# Patient Record
Sex: Female | Born: 1995 | Race: Black or African American | Hispanic: No | Marital: Single
Health system: Midwestern US, Community
[De-identification: ages and names within clinical notes are randomized; demographics above are authoritative.]

## PROBLEM LIST (undated history)

## (undated) DIAGNOSIS — K219 Gastro-esophageal reflux disease without esophagitis: Secondary | ICD-10-CM

## (undated) DIAGNOSIS — G43909 Migraine, unspecified, not intractable, without status migrainosus: Secondary | ICD-10-CM

## (undated) HISTORY — DX: Migraine, unspecified, not intractable, without status migrainosus: G43.909

## (undated) HISTORY — DX: Gastro-esophageal reflux disease without esophagitis: K21.9

## (undated) HISTORY — PX: WISDOM TOOTH EXTRACTION: SHX21

---
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
## 2017-01-23 ENCOUNTER — Encounter: Attending: Obstetrics & Gynecology

## 2017-08-26 ENCOUNTER — Encounter: Payer: Self-pay | Admitting: Nurse Practitioner

## 2017-09-03 ENCOUNTER — Ambulatory Visit (INDEPENDENT_AMBULATORY_CARE_PROVIDER_SITE_OTHER): Admitting: Physician Assistant

## 2017-09-03 ENCOUNTER — Encounter: Payer: Self-pay | Admitting: Physician Assistant

## 2017-09-03 ENCOUNTER — Other Ambulatory Visit (INDEPENDENT_AMBULATORY_CARE_PROVIDER_SITE_OTHER)

## 2017-09-03 VITALS — BP 118/60 | HR 86 | Ht 64.5 in | Wt 124.0 lb

## 2017-09-03 DIAGNOSIS — R630 Anorexia: Secondary | ICD-10-CM

## 2017-09-03 DIAGNOSIS — R1013 Epigastric pain: Secondary | ICD-10-CM

## 2017-09-03 LAB — COMPREHENSIVE METABOLIC PANEL
ALBUMIN: 4.2 g/dL (ref 3.5–5.2)
ALT: 10 U/L (ref 0–35)
AST: 15 U/L (ref 0–37)
Alkaline Phosphatase: 77 U/L (ref 47–119)
BILIRUBIN TOTAL: 0.1 mg/dL — AB (ref 0.2–1.2)
BUN: 13 mg/dL (ref 6–23)
CALCIUM: 10.2 mg/dL (ref 8.4–10.5)
CHLORIDE: 106 meq/L (ref 96–112)
CO2: 24 mEq/L (ref 19–32)
Creatinine, Ser: 1.13 mg/dL (ref 0.40–1.20)
GFR: 79.25 mL/min (ref >60.00)
Glucose, Bld: 101 mg/dL — ABNORMAL HIGH (ref 70–99)
Potassium: 3.9 mEq/L (ref 3.5–5.1)
SODIUM: 144 meq/L (ref 135–145)
TOTAL PROTEIN: 7.3 g/dL (ref 6.0–8.3)

## 2017-09-03 LAB — SEDIMENTATION RATE: Sed Rate: 24 mm/hr — ABNORMAL HIGH (ref 0–20)

## 2017-09-03 LAB — CBC WITH DIFFERENTIAL/PLATELET
BASOS ABS: 0.1 10*3/uL (ref 0.0–0.1)
BASOS PCT: 0.7 % (ref 0.0–3.0)
EOS ABS: 0.1 10*3/uL (ref 0.0–0.7)
Eosinophils Relative: 0.8 % (ref 0.0–5.0)
HEMATOCRIT: 37.8 % (ref 36.0–49.0)
HEMOGLOBIN: 12 g/dL (ref 12.0–16.0)
LYMPHS PCT: 31.8 % (ref 24.0–48.0)
Lymphs Abs: 3.3 10*3/uL (ref 0.7–4.0)
MCHC: 31.9 g/dL (ref 31.0–37.0)
MCV: 72.5 fl — ABNORMAL LOW (ref 78.0–98.0)
MONO ABS: 0.7 10*3/uL (ref 0.1–1.0)
Monocytes Relative: 6.5 % (ref 3.0–12.0)
Neutro Abs: 6.2 10*3/uL (ref 1.4–7.7)
Neutrophils Relative %: 60.2 % (ref 43.0–71.0)
PLATELETS: 361 10*3/uL (ref 150.0–575.0)
RBC: 5.21 Mil/uL (ref 3.80–5.70)
RDW: 13.3 % (ref 11.4–15.5)
WBC: 10.4 10*3/uL (ref 4.5–13.5)

## 2017-09-03 LAB — LIPASE: Lipase: 27 U/L (ref 11.0–59.0)

## 2017-09-03 LAB — IBC PANEL
IRON: 68 ug/dL (ref 42–145)
Saturation Ratios: 15.1 % — ABNORMAL LOW (ref 20.0–50.0)
TRANSFERRIN: 321 mg/dL (ref 212.0–360.0)

## 2017-09-03 LAB — FERRITIN: Ferritin: 61.7 ng/mL (ref 10.0–291.0)

## 2017-09-03 LAB — HIGH SENSITIVITY CRP: CRP, High Sensitivity: 1.5 mg/L (ref 0.000–5.000)

## 2017-09-03 MED ORDER — OMEPRAZOLE 40 MG PO CPDR: 40.0000 mg | DELAYED_RELEASE_CAPSULE | Freq: Every day | ORAL | 2 refills | Status: DC

## 2017-09-03 NOTE — Progress Notes (Signed)
Reviewed and agree with initial management plan.  Malcolm T. Stark, MD FACG 

## 2017-09-03 NOTE — Patient Instructions (Signed)
Please go to the basement level to have your labs drawn.  Continue Omeprazole 40 mg, take 1 tab by mouth every morning x 2 months.   If you are age 20 or younger, your body mass index should be between 19-25. Your Body mass index is 20.96 kg/m. If this is out of the aformentioned range listed, please consider follow up with your Primary Care Provider.

## 2017-09-03 NOTE — Progress Notes (Signed)
Subjective:    Patient ID: Jennifer Smith, female    DOB: 11/28/1995, 22 y.o.   MRN: [id number]  HPI  [name] Cough is a pleasant 22 year old African American female who is a Consulting civil engineer at [employer].  She is self-referred today for evaluation of epigastric pain.  [facility]. Patient says her current symptoms started a couple of months ago with intermittent stomachaches she describes as an achy crampy feeling in her epigastrium and decrease in appetite.  She did not have any associated nausea vomiting fever diarrhea.  She has lost about 6 pounds total.  She says her bowels are moving well.  She has no complaints of heartburn or indigestion chronically.  She was started on a trial of omeprazole 40 mg daily by the student health center and says that that has been helpful but has not alleviated her symptoms.  She has been on omeprazole over the past 2-1/2 weeks.  Says generally her symptoms were more noticeable early in the mornings.  She does have migraines but says she does not take any aspirin or NSAIDs on a regular basis.  She denies excessive EtOH use.  She does admit to being significantly stressed over the past couple of months says she has had a lot going on outside of school personally. We did obtain copies of labs done on 08/15/2017 which showed normal chemistries, amylase was mildly elevated at 114, lipase 21, WBC 11.3, hemoglobin 11.7 hematocrit of 36 and MCV of 72.3 We also received some of these labs after patient left the office and H. pylori breath testing was done and was negative.  Urine hCG was done and negative, as well as STD testing. Family history is negative for GI disease other than a second-degree relative with Crohn's.  Review of Systems Pertinent positive and negative review of systems were noted in the above HPI section.  All other review of systems was otherwise negative.  Outpatient Encounter Medications as of 09/03/2017  Medication Sig    . cyproheptadine (PERIACTIN) 4 MG tablet Take 4 mg by mouth 2 (two) times daily.  Marland Kitchen omeprazole (PRILOSEC) 40 MG capsule Take 1 capsule (40 mg total) by mouth daily.  . [DISCONTINUED] omeprazole (PRILOSEC) 40 MG capsule Take 40 mg by mouth daily.   No facility-administered encounter medications on file as of 09/03/2017.    No Known Allergies There are no active problems to display for this patient.  Social History   Socioeconomic History  . Marital status: Single    Spouse name: Not on file  . Number of children: Not on file  . Years of education: Not on file  . Highest education level: Not on file  Social Needs  . Financial resource strain: Not on file  . Food insecurity - worry: Not on file  . Food insecurity - inability: Not on file  . Transportation needs - medical: Not on file  . Transportation needs - non-medical: Not on file  Occupational History  . Not on file  Tobacco Use  . Smoking status: Never Smoker  . Smokeless tobacco: Never Used  Substance and Sexual Activity  . Alcohol use: No    Frequency: Never  . Drug use: No  . Sexual activity: Not on file  Other Topics Concern  . Not on file  Social History Narrative  . Not on file    Ms. Mies's family history includes Diabetes in her paternal grandmother.      Objective:  Vitals:   09/03/17 1058  BP: 118/60  Pulse: 86  SpO2: 98%    Physical Exam; well-developed young African-American female in no acute distress, accompanied by her mother blood pressure 118/60 pulse 86, height 5 foot 4, weight 124, BMI of 20.9.  HEENT ;nontraumatic normocephalic EOMI PERRLA sclerae anicteric, Cardiovascular; regular rate and rhythm with S1-S2 no murmur rub or gallop, Pulmonary; clear bilaterally, Abdomen soft, she has some minimal tenderness in the epigastrium no guarding or rebound no palpable mass or hepatosplenomegaly, Rectal ;exam not done, Extremities; no clubbing cyanosis or edema skin warm and dry, Neuro psych; mood  and affect appropriate       Assessment & Plan:   #791 22 year old female with 8739-month history of epigastric discomfort described as cramping and aching in initially with decrease in appetite.  She has had improvement with empiric omeprazole 40 mg every morning. Previous labs were obtained after her office visit which did show a mildly elevated WBC and mildly elevated amylase on 08/15/2017.  She is also microcytic.  Etiology of her symptoms is not entirely clear.  Suspect gastritis, possible peptic ulcer disease.  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 every morning x2 more months then stop We will check CBC, CMET,sed rate CRP, lipase today.  I also added iron studies due to microcytosis. Patient was advised to avoid NSAIDs and alcohol. Further plans pending results of labs. I do not think she needs endoscopic evaluation at this time.  If labs are unrevealing we will plan to follow-up in the office in about 6 weeks.    Amy S Esterwood PA-C 09/03/2017   Cc: No ref. provider found

## 2017-09-05 ENCOUNTER — Ambulatory Visit: Payer: Self-pay | Admitting: Nurse Practitioner

## 2017-09-05 LAB — H PYLORI, IGM, IGG, IGA AB: H pylori, IgM Abs: <9 units (ref 0.0–8.9)

## 2017-09-09 ENCOUNTER — Telehealth: Payer: Self-pay

## 2017-09-09 NOTE — Telephone Encounter (Signed)
Discussed lab results with the mother. Questions about the significance of the ESR. She states the daughter has been under significant stress. Also mentions the patient had "mono" in the past few months.  She will continue to talk with her daughter to monitor if she is not continuing to improve. Call back PRN concerns.

## 2017-10-02 ENCOUNTER — Telehealth: Payer: Self-pay | Admitting: Physician Assistant

## 2017-10-02 NOTE — Telephone Encounter (Signed)
I spoke with mom this morning.  Patient is scheduled for pre-op visit on 10/23/17 at 1:00pm and EGD on 10/29/17 at 3:30. I mailed her a coupon for FDgard as recommended by Dr Russella DarStark.

## 2017-10-02 NOTE — Telephone Encounter (Signed)
Schedule EGD in [facility] Increase omeprazole to 40 mg po bid If that is not helpful add FDgard 1-2 po tid

## 2017-10-02 NOTE — Telephone Encounter (Signed)
Mother says the patient has told her the symptoms are unchanged. She wakes up with abdominal pain. It will ease off during the day "mostly." She has had episodes of vomiting. Not daily vomiting. Patient states to her mother, " I am not able to eat all my food like I used to." Mother asks if she can have the EGD. Please advise.

## 2017-10-02 NOTE — Telephone Encounter (Signed)
Pt's mother Sheralyn Boatmanoni would like a call regarding pt, she is still dealing with same sxs.

## 2017-10-06 NOTE — Telephone Encounter (Signed)
Ok thank you 

## 2017-10-07 ENCOUNTER — Other Ambulatory Visit: Payer: Self-pay

## 2017-10-13 ENCOUNTER — Other Ambulatory Visit: Payer: Self-pay

## 2017-10-13 ENCOUNTER — Ambulatory Visit (AMBULATORY_SURGERY_CENTER): Payer: Self-pay | Admitting: *Deleted

## 2017-10-13 VITALS — Ht 64.5 in | Wt 123.0 lb

## 2017-10-13 DIAGNOSIS — R1013 Epigastric pain: Secondary | ICD-10-CM

## 2017-10-13 NOTE — Progress Notes (Signed)
Patient denies any allergies to eggs or soy. Patient denies any problems with anesthesia/sedation. Patient denies any oxygen use at home. Patient denies taking any diet/weight loss medications or blood thinners. EMMI education assisgned to patient on EGD, this was explained and instructions given to patient. 

## 2017-10-17 ENCOUNTER — Telehealth: Payer: Self-pay

## 2017-10-17 NOTE — Telephone Encounter (Signed)
Letter from 10/03/07 to patient about FDgard with coupon is still showing as open but I do believe that it has already been sent.  Just to be sure, I sent the letter and coupon again.

## 2017-10-20 ENCOUNTER — Encounter: Admitting: Gastroenterology

## 2017-10-21 ENCOUNTER — Ambulatory Visit: Admitting: Physician Assistant

## 2017-10-23 ENCOUNTER — Encounter

## 2017-10-28 ENCOUNTER — Other Ambulatory Visit: Payer: Self-pay

## 2017-10-28 ENCOUNTER — Encounter: Payer: Self-pay | Admitting: Gastroenterology

## 2017-10-28 ENCOUNTER — Ambulatory Visit (AMBULATORY_SURGERY_CENTER): Admitting: Gastroenterology

## 2017-10-28 VITALS — BP 103/56 | HR 109 | Temp 98.6°F | Resp 20 | Ht 64.5 in | Wt 123.0 lb

## 2017-10-28 DIAGNOSIS — K319 Disease of stomach and duodenum, unspecified: Secondary | ICD-10-CM

## 2017-10-28 DIAGNOSIS — K2950 Unspecified chronic gastritis without bleeding: Secondary | ICD-10-CM | POA: Diagnosis not present

## 2017-10-28 DIAGNOSIS — R1013 Epigastric pain: Secondary | ICD-10-CM

## 2017-10-28 NOTE — Patient Instructions (Signed)
Thank you for allowing [place] to care for you today!  Await pathology results by mail, approx. 2 weeks.  Resume previous medications and diet.  Try FDgard 1-2 tabs by mouth three times/day as needed for abdominal pain.  Contact GI office for appointment as needed.     YOU HAD AN ENDOSCOPIC PROCEDURE TODAY AT THE [facility]:   Refer to the procedure report that was given to you for any specific questions about what was found during the examination.  If the procedure report does not answer your questions, please call your gastroenterologist to clarify.  If you requested that your care partner not be given the details of your procedure findings, then the procedure report has been included in a sealed envelope for you to review at your convenience later.  YOU SHOULD EXPECT: Some feelings of bloating in the abdomen. Passage of more gas than usual.  Walking can help get rid of the air that was put into your GI tract during the procedure and reduce the bloating. If you had a lower endoscopy (such as a colonoscopy or flexible sigmoidoscopy) you may notice spotting of blood in your stool or on the toilet paper. If you underwent a bowel prep for your procedure, you may not have a normal bowel movement for a few days.  Please Note:  You might notice some irritation and congestion in your nose or some drainage.  This is from the oxygen used during your procedure.  There is no need for concern and it should clear up in a day or so.  SYMPTOMS TO REPORT IMMEDIATELY:    Following upper endoscopy (EGD)  Vomiting of blood or coffee ground material  New chest pain or pain under the shoulder blades  Painful or persistently difficult swallowing  New shortness of breath  Fever of 100F or higher  Black, tarry-looking stools  For urgent or emergent issues, a gastroenterologist can be reached at any hour by calling [phone].   DIET:  We do recommend a small meal at first, but then you may  proceed to your regular diet.  Drink plenty of fluids but you should avoid alcoholic beverages for 24 hours.  ACTIVITY:  You should plan to take it easy for the rest of today and you should NOT DRIVE or use heavy machinery until tomorrow (because of the sedation medicines used during the test).    FOLLOW UP: Our staff will call the number listed on your records the next business day following your procedure to check on you and address any questions or concerns that you may have regarding the information given to you following your procedure. If we do not reach you, we will leave a message.  However, if you are feeling well and you are not experiencing any problems, there is no need to return our call.  We will assume that you have returned to your regular daily activities without incident.  If any biopsies were taken you will be contacted by phone or by letter within the next 1-3 weeks.  Please call us at [phone] if you have not heard about the biopsies in 3 weeks.    SIGNATURES/CONFIDENTIALITY: You and/or your care partner have signed paperwork which will be entered into your electronic medical record.  These signatures attest to the fact that that the information above on your After Visit Summary has been reviewed and is understood.  Full responsibility of the confidentiality of this discharge information lies with you and/or your care-partner.

## 2017-10-28 NOTE — Progress Notes (Signed)
Called to room to assist during endoscopic procedure.  Patient ID and intended procedure confirmed with present staff. Received instructions for my participation in the procedure from the performing physician.  

## 2017-10-28 NOTE — Progress Notes (Signed)
Spontaneous respirations throughout. VSS. Resting comfortably. To PACU on room air. Report to  RN. 

## 2017-10-28 NOTE — Op Note (Addendum)
[facility] Patient Name: Jennifer Smith Procedure Date: 10/28/2017 1:31 PM MRN: [id number] Endoscopist: Meryl Dare , MD Age: 22 Referring MD:  Date of Birth: [birth year] Gender: Female Account #: [id number] Procedure:                Upper GI endoscopy Indications:              Epigastric abdominal pain Medicines:                Monitored Anesthesia Care Procedure:                Pre-Anesthesia Assessment:                           - Prior to the procedure, a History and Physical                            was performed, and patient medications and                            allergies were reviewed. The patient's tolerance of                            previous anesthesia was also reviewed. The risks                            and benefits of the procedure and the sedation                            options and risks were discussed with the patient.                            All questions were answered, and informed consent                            was obtained. Prior Anticoagulants: The patient has                            taken no previous anticoagulant or antiplatelet                            agents. ASA Grade Assessment: I - A normal, healthy                            patient. After reviewing the risks and benefits,                            the patient was deemed in satisfactory condition to                            undergo the procedure.                           After obtaining informed consent, the endoscope was  passed under direct vision. Throughout the                            procedure, the patient's blood pressure, pulse, and                            oxygen saturations were monitored continuously. The                            Endoscope was introduced through the mouth, and                            advanced to the second part of duodenum. The upper                            GI endoscopy was accomplished without  difficulty.                            The patient tolerated the procedure well. Scope In: Scope Out: Findings:                 The examined esophagus was normal.                           A single umbilicated lesion measuring 6 mm in                            diameter was found on the greater curvature of the                            gastric antrum. Biopsies were taken with a cold                            forceps for histology.                           Patchy mildly erythematous mucosa without bleeding                            was found in the gastric fundus and in the gastric                            body. Biopsies were taken with a cold forceps for                            histology.                           The exam of the stomach was otherwise normal.                           The duodenal bulb and second portion of the                            duodenum  were normal. Complications:            No immediate complications. Estimated Blood Loss:     Estimated blood loss was minimal. Impression:               - Normal esophagus.                           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 Biopsied.                           - Mildly erythematous mucosa in the gastric fundus                            and gastric body. Biopsied.                           - Normal duodenal bulb and second portion of the                            duodenum. Recommendation:           - Patient has a contact number available for                            emergencies. The signs and symptoms of potential                            delayed complications were discussed with the                            patient. Return to normal activities tomorrow.                            Written discharge instructions were provided to the                            patient.                           - Resume previous diet.                            - Continue present medications.                           - Await pathology results.                           - FDgard 1-2 PO TID as needed for abdominal pain                           - GI office appointment as needed Meryl Dare, MD 10/28/2017 1:50:32 PM This report has been signed electronically.

## 2017-10-29 ENCOUNTER — Telehealth: Payer: Self-pay | Admitting: *Deleted

## 2017-10-29 ENCOUNTER — Encounter: Admitting: Gastroenterology

## 2017-10-29 NOTE — Telephone Encounter (Signed)
No answer for post procedure call back. Will attempt to call back later this afternoon. SM 

## 2017-10-29 NOTE — Telephone Encounter (Signed)
Voicemail not set up so unable to leave message for post procedure call back. SM

## 2017-10-29 NOTE — Telephone Encounter (Signed)
Patient mom returned phone call stating that patient is feeling fine and not having any problems.

## 2017-11-09 ENCOUNTER — Encounter: Payer: Self-pay | Admitting: Gastroenterology

## 2017-11-11 ENCOUNTER — Telehealth: Payer: Self-pay | Admitting: Gastroenterology

## 2017-11-11 NOTE — Telephone Encounter (Signed)
I reviewed the results with the patient's mom.  All questions answered.  She will call back for any additional questions or concerns.

## 2017-12-05 ENCOUNTER — Telehealth: Payer: Self-pay | Admitting: Gastroenterology

## 2017-12-05 NOTE — Telephone Encounter (Signed)
I spoke with mom and reviewed the office note from September 03, 2017.  In that note it said for patient to continue the omeprazole for 2 months and then stop.  I gave mom this information.  EGD procedure note indicates the patient was given samples of FDguard with instructions to take 1-2 po up to TID as needed for abdominal pain. I asked mom to call in a week or two after patient stops omeprazole and let us know how she is doing.

## 2018-01-22 NOTE — Telephone Encounter (Signed)
Call received at [id number]    22 year old patient last seen in the office on 09/21/14.      Mother calling (on old HIPPA form) to ask about her daughter's PCP changing her ocp .     This nurse stated that patient needs to be seen for new patient appointment.    Mother verbalized understanding

## 2018-06-11 ENCOUNTER — Ambulatory Visit: Attending: Obstetrics & Gynecology

## 2018-06-11 ENCOUNTER — Ambulatory Visit: Admit: 2018-06-11 | Discharge: 2018-06-11 | Payer: PRIVATE HEALTH INSURANCE | Attending: Obstetrics & Gynecology

## 2018-06-11 DIAGNOSIS — L292 Pruritus vulvae: Secondary | ICD-10-CM

## 2018-06-11 LAB — AMB POC SMEAR, STAIN & INTERPRET, WET MOUNT
WET MOUNT POCT, WMPOCT: NEGATIVE
Wet mount (POC): NEGATIVE

## 2018-06-11 NOTE — Progress Notes (Signed)
Sent mychart message to pt

## 2018-06-11 NOTE — Patient Instructions (Signed)
Pelvic Exam: Care Instructions  Your Care Instructions    When your doctor examines all of your pelvic organs, it's called a pelvic exam. Two good reasons to have this kind of exam are to check for sexually transmitted infections (STIs) and to get a Pap test. A Pap test is also called a Pap smear. It checks for early changes that can lead to cancer of the cervix.  Sometimes a pelvic exam is part of a regular checkup. In this case, you can do some things to make your test results as accurate as possible.  ?? Try to schedule the exam when you don't have your period.  ?? Don't use douches, tampons, or vaginal medicines, sprays, or powders for 24 hours before your exam.  ?? Don't have sex for 24 hours before your exam.  Other times, women have this kind of exam at any time of the month. This is because they have pelvic pain, bleeding, or discharge. Or they may have another pelvic problem.  Before your exam, it's important to share some information with your doctor. For example, if you are a survivor of rape or sexual abuse, you can talk about any concerns you may have. Your doctor will also want to know if you are pregnant or use birth control. And he or she will want to hear about any problems, surgeries, or procedures you have had in your pelvic area. You will also need to tell your doctor when your last period was.  Follow-up care is a key part of your treatment and safety. Be sure to make and go to all appointments, and call your doctor if you are having problems. It's also a good idea to know your test results and keep a list of the medicines you take.  How is a pelvic exam done?  ?? During a pelvic exam, you will:  ? Take off your clothes below the waist. You will get a paper or cloth cover to put over the lower half of your body.  ? Lie on your back on an exam table. Your feet will be raised above you. Stirrups will support your feet.  ?? The doctor will:   ? Ask you to relax your knees. Your knees need to lean out, toward the walls.  ? Check the opening of your vagina for sores or swelling.  ? Gently put a tool called a speculum into your vagina. It opens the vagina a little bit. You will feel some pressure. But if you are relaxed, it will not hurt. It lets your doctor see inside the vagina.  ? Use a small brush, spatula, or swab to get a sample of cells, if you are having a Pap test or culture. The doctor then removes the speculum.  ? Put on gloves and put one or two fingers of one hand into your vagina. The other hand goes on your lower belly. This lets your doctor feel your pelvic organs. You will probably feel some pressure. Try to stay relaxed.  ? Put one gloved finger into your rectum and one into your vagina, if needed. This can also help check your pelvic organs.  This exam takes about 10 minutes. At the end, you will get a washcloth or tissue to clean your vaginal area. It's normal to have some discharge after this exam. You can then get dressed.  Some test results may be ready right away. But results from a culture or a Pap test may take several days or a   few weeks.  Why should you have a pelvic exam?  ?? You want to have recommended screening tests. This includes a Pap test.  ?? You think you have a vaginal infection. Signs include itching, burning, or unusual discharge.  ?? You might have been exposed to a sexually transmitted infection (STI), such as chlamydia or herpes.  ?? You have vaginal bleeding that is not part of your normal menstrual period.  ?? You have pain in your belly or pelvis.  ?? You have been sexually assaulted. A pelvic exam lets your doctor collect evidence and check for STIs.  ?? You are pregnant.  ?? You are having trouble getting pregnant.  What are the risks of a pelvic exam?  There are no risks from a pelvic exam.  When should you call for help?  Watch closely for changes in your health, and be sure to contact your  doctor if you have any problems.  Where can you learn more?  Go to http://www.healthwise.net/[employer].  Enter M421 in the search box to learn more about "Pelvic Exam: Care Instructions."  Current as of: August 05, 2017  Content Version: 12.2  ?? 2006-2019 [employer]. Care instructions adapted under license by [employer] (which disclaims liability or warranty for this information). If you have questions about a medical condition or this instruction, always ask your healthcare professional. [employer] disclaims any warranty or liability for your use of this information.

## 2018-06-11 NOTE — Progress Notes (Signed)
Chief Complaint   Pt complains of vulvar itching.       HPI  22 y.o. female complains of intermittent vulvar itching after getting a wax since August. LMP 05/04/18.  (Saw Dr. Providence LaniusHowell for single visit 09/2014)  She denies additional symptoms at this time. No vaginal discharge.  The patient  denies aggravating factors    Previous treatment included: using cream prescribed by urgent care (not sure what it was). Uses prn, does help, hasn't used consistently.  Pt also tried aquaphor to relieve irritation.    Has not waxed again, but does shave.  Would like vaginal swab for STD screening.  Declines blood work.    Past Medical History:   Diagnosis Date   ??? Cyclic vomiting syndrome    ??? Migraine    ??? Migraines      History reviewed. No pertinent surgical history.  Social History     Occupational History   ??? Not on file   Tobacco Use   ??? Smoking status: Never Smoker   ??? Smokeless tobacco: Never Used   Substance and Sexual Activity   ??? Alcohol use: No     Alcohol/week: 0.0 standard drinks   ??? Drug use: No   ??? Sexual activity: Never     Family History   Problem Relation Age of Onset   ??? Breast Cancer Paternal Grandmother         No Known Allergies  Prior to Admission medications    Medication Sig Start Date End Date Taking? Authorizing Provider   L-Norgest&E Estradiol-E Estrad (ASHLYNA) 0.15 mg-30 mcg (84)/10 mcg (7) 3MPk Take  by mouth.   Yes Provider, Historical   cyproheptadine HCl (CYPROHEPTADINE PO) Take  by mouth.   Yes Provider, Historical   Butalbital-Acetaminophen-Caff 50-300-40 mg cap  06/28/14  Yes Provider, Historical                      Review of Systems - History obtained from the patient  Constitutional: negative for weight loss, fever, night sweats  Breast: negative for breast lumps, nipple discharge, galactorrhea  GI: negative for change in bowel habits, abdominal pain, black or bloody stools  GU: negative for frequency, dysuria, hematuria  MSK: negative for back pain, joint pain, muscle pain   Skin: negative for itching, rash, hives  Neuro: negative for dizziness, headache, confusion, weakness  Psych: negative for anxiety, depression, change in mood  Heme/lymph: negative for bleeding, bruising, pallor       Objective:    Visit Vitals  BP 115/72 (BP 1 Location: Left arm, BP Patient Position: Sitting)   Pulse 80   Ht 5\' 4"  (1.626 m)   Wt 130 lb (59 kg)   LMP 05/04/2018 (Approximate)   BMI 22.31 kg/m??       Physical Exam:   PHYSICAL EXAMINATION    Constitutional  ?? Appearance: well-nourished, well developed, alert, in no acute distress    HENT  ?? Head and Face: appears normal    Genitourinary  ?? External Genitalia: normal appearance for age, no discharge present, no tenderness present, no inflammatory lesions present, no masses present, no atrophy present; shaven, no rash or focal lesions  ?? Vagina:  Scant white discharge present, otherwise normal vaginal vault without central or paravaginal defects, no inflammatory lesions present, no masses present  ?? Bladder: non-tender to palpation  ?? Urethra: appears normal  ?? Cervix: normal   ?? Uterus: normal size, shape and consistency  ?? Adnexa: no adnexal  tenderness present, no adnexal masses present  ?? Perineum: perineum within normal limits, no evidence of trauma, no rashes or skin lesions present  ?? Anus: anus within normal limits, no hemorrhoids present  ?? Inguinal Lymph Nodes: no lymphadenopathy present    Skin  ?? General Inspection: no rash, no lesions identified    Neurologic/Psychiatric  ?? Mental Status:  ?? Orientation: grossly oriented to person, place and time  ?? Mood and Affect: mood normal, affect appropriate          Results for orders placed or performed in visit on 06/11/18   AMB POC SMEAR, STAIN & INTERPRET, WET MOUNT   Result Value Ref Range    Wet mount (POC) negative     Narrative    WP/KOH    Hypae: negative  Buds: negative  Whiff: negative    Wet Prep:  Trich: negative  Clue cells: negative  Hyphae: negative  Buds: negative  WBC's: normal          Assessment:   Vulvar itching d/t shaving/waxing  Req STD screen    Plan:   Discussed itching d/t skin irritation from hair removal  Advised to avoid shaving/waxing  OTC cortisone. Apply 2-4x/d for 1-2wks  Nuswab G/C/T  Enc to schedule AE    Orders Placed This Encounter   ??? CT/NG/T.VAGINALIS AMPLIFICATION     Order Specific Question:   Specimen source     Answer:   Vaginal [[id number]]   ??? AMB POC SMEAR, STAIN & INTERPRET, WET MOUNT

## 2018-06-11 NOTE — Progress Notes (Signed)
Chief Complaint   Pt complains of vulvar itching.       HPI  22 y.o. female complains of intermittent vulvar itching after getting a wax since August. LMP 05/04/18.  (Saw Dr. Providence LaniusHowell for single visit 09/2014)  She denies additional symptoms at this time. No vaginal discharge.  The patient  denies aggravating factors    Previous treatment included: using cream prescribed by urgent care (not sure what it was). Uses prn, does help, hasn't used consistently.  Pt also tried aquaphor to relieve irritation.    Has not waxed again, but does shave.  Would like vaginal swab for STD screening.  Declines blood work.    Past Medical History:   Diagnosis Date   ??? Cyclic vomiting syndrome    ??? Migraine    ??? Migraines      History reviewed. No pertinent surgical history.  Social History     Occupational History   ??? Not on file   Tobacco Use   ??? Smoking status: Never Smoker   ??? Smokeless tobacco: Never Used   Substance and Sexual Activity   ??? Alcohol use: No     Alcohol/week: 0.0 standard drinks   ??? Drug use: No   ??? Sexual activity: Never     Family History   Problem Relation Age of Onset   ??? Breast Cancer Paternal Grandmother         No Known Allergies  Prior to Admission medications    Medication Sig Start Date End Date Taking? Authorizing Provider   L-Norgest&E Estradiol-E Estrad (ASHLYNA) 0.15 mg-30 mcg (84)/10 mcg (7) 3MPk Take  by mouth.   Yes Provider, Historical   cyproheptadine HCl (CYPROHEPTADINE PO) Take  by mouth.   Yes Provider, Historical   Butalbital-Acetaminophen-Caff 50-300-40 mg cap  06/28/14  Yes Provider, Historical                      Review of Systems - History obtained from the patient  Constitutional: negative for weight loss, fever, night sweats  Breast: negative for breast lumps, nipple discharge, galactorrhea  GI: negative for change in bowel habits, abdominal pain, black or bloody stools  GU: negative for frequency, dysuria, hematuria  MSK: negative for back pain, joint pain, muscle pain  Skin: negative for  itching, rash, hives  Neuro: negative for dizziness, headache, confusion, weakness  Psych: negative for anxiety, depression, change in mood  Heme/lymph: negative for bleeding, bruising, pallor       Objective:    Visit Vitals  BP 115/72 (BP 1 Location: Left arm, BP Patient Position: Sitting)   Pulse 80   Ht 5\' 4"  (1.626 m)   Wt 130 lb (59 kg)   LMP 05/04/2018 (Approximate)   BMI 22.31 kg/m??       Physical Exam:   PHYSICAL EXAMINATION    Constitutional  ?? Appearance: well-nourished, well developed, alert, in no acute distress    HENT  ?? Head and Face: appears normal    Genitourinary  ?? External Genitalia: normal appearance for age, no discharge present, no tenderness present, no inflammatory lesions present, no masses present, no atrophy present; shaven, no rash or focal lesions  ?? Vagina:  Scant white discharge present, otherwise normal vaginal vault without central or paravaginal defects, no inflammatory lesions present, no masses present  ?? Bladder: non-tender to palpation  ?? Urethra: appears normal  ?? Cervix: normal   ?? Uterus: normal size, shape and consistency  ?? Adnexa: no adnexal  tenderness present, no adnexal masses present  ?? Perineum: perineum within normal limits, no evidence of trauma, no rashes or skin lesions present  ?? Anus: anus within normal limits, no hemorrhoids present  ?? Inguinal Lymph Nodes: no lymphadenopathy present    Skin  ?? General Inspection: no rash, no lesions identified    Neurologic/Psychiatric  ?? Mental Status:  ?? Orientation: grossly oriented to person, place and time  ?? Mood and Affect: mood normal, affect appropriate          Results for orders placed or performed in visit on 06/11/18   AMB POC SMEAR, STAIN & INTERPRET, WET MOUNT   Result Value Ref Range    Wet mount (POC) negative     Narrative    WP/KOH    Hypae: negative  Buds: negative  Whiff: negative    Wet Prep:  Trich: negative  Clue cells: negative  Hyphae: negative  Buds: negative  WBC's: normal         Assessment:    Vulvar itching d/t shaving/waxing  Req STD screen    Plan:   Discussed itching d/t skin irritation from hair removal  Advised to avoid shaving/waxing  OTC cortisone. Apply 2-4x/d for 1-2wks  Nuswab G/C/T  Enc to schedule AE    Orders Placed This Encounter   ??? CT/NG/T.VAGINALIS AMPLIFICATION     Order Specific Question:   Specimen source     Answer:   Vaginal [[id number]]   ??? AMB POC SMEAR, STAIN & INTERPRET, WET MOUNT

## 2018-06-13 LAB — CT/NG/T.VAGINALIS AMPLIFICATION
C. trachomatis by NAA: NEGATIVE
CHLAMYDIA BY NAA, 183161: NEGATIVE
GONOCOCCUS BY NAA, 183162: NEGATIVE
N. gonorrhoeae by NAA: NEGATIVE
T. vaginalis by NAA: NEGATIVE
TRICH VAG BY NAA: NEGATIVE

## 2019-04-08 ENCOUNTER — Other Ambulatory Visit: Payer: Self-pay

## 2019-04-08 ENCOUNTER — Encounter (HOSPITAL_COMMUNITY): Payer: Self-pay | Admitting: Emergency Medicine

## 2019-04-08 ENCOUNTER — Emergency Department (HOSPITAL_COMMUNITY)

## 2019-04-08 ENCOUNTER — Emergency Department (HOSPITAL_COMMUNITY)
Admission: EM | Admit: 2019-04-08 | Discharge: 2019-04-09 | Disposition: A | Discharge status: Home / Self Care | Attending: Emergency Medicine | Admitting: Emergency Medicine

## 2019-04-08 DIAGNOSIS — Z733 Stress, not elsewhere classified: Secondary | ICD-10-CM | POA: Insufficient documentation

## 2019-04-08 DIAGNOSIS — Z79899 Other long term (current) drug therapy: Secondary | ICD-10-CM | POA: Diagnosis not present

## 2019-04-08 DIAGNOSIS — R002 Palpitations: Secondary | ICD-10-CM | POA: Insufficient documentation

## 2019-04-08 DIAGNOSIS — R079 Chest pain, unspecified: Secondary | ICD-10-CM | POA: Diagnosis not present

## 2019-04-08 LAB — CBC
HCT: 41.6 % (ref 36.0–46.0)
Hemoglobin: 12.6 g/dL (ref 12.0–15.0)
MCH: 23.2 pg — ABNORMAL LOW (ref 26.0–34.0)
MCHC: 30.3 g/dL (ref 30.0–36.0)
MCV: 76.5 fL — ABNORMAL LOW (ref 80.0–100.0)
Platelets: 390 10*3/uL (ref 150–400)
RBC: 5.44 MIL/uL — ABNORMAL HIGH (ref 3.87–5.11)
RDW: 13.9 % (ref 11.5–15.5)
WBC: 11.8 10*3/uL — ABNORMAL HIGH (ref 4.0–10.5)
nRBC: 0 % (ref 0.0–0.2)

## 2019-04-08 LAB — BASIC METABOLIC PANEL
Anion gap: 9 (ref 5–15)
BUN: 10 mg/dL (ref 6–20)
CO2: 22 mmol/L (ref 22–32)
Calcium: 9.5 mg/dL (ref 8.9–10.3)
Chloride: 107 mmol/L (ref 98–111)
Creatinine, Ser: 1.11 mg/dL — ABNORMAL HIGH (ref 0.44–1.00)
GFR calc Af Amer: >60 mL/min (ref >60)
GFR calc non Af Amer: >60 mL/min (ref >60)
Glucose, Bld: 75 mg/dL (ref 70–99)
Potassium: 3.6 mmol/L (ref 3.5–5.1)
Sodium: 138 mmol/L (ref 135–145)

## 2019-04-08 LAB — I-STAT BETA HCG BLOOD, ED (NOT ORDERABLE): I-stat hCG, quantitative: <5 m[IU]/mL (ref <5)

## 2019-04-08 LAB — TROPONIN I (HIGH SENSITIVITY)
Troponin I (High Sensitivity): <2 ng/L (ref <18)
Troponin I (High Sensitivity): <2 ng/L (ref <18)

## 2019-04-08 MED ORDER — SODIUM CHLORIDE 0.9% FLUSH: 3.0000 mL | Freq: Once | INTRAVENOUS | Status: DC

## 2019-04-08 NOTE — ED Notes (Signed)
PT CAME TO WINDOW. MADE HERSELF KNOW TO THIS WRITER. PT WENT TO ROOM

## 2019-04-08 NOTE — ED Provider Notes (Signed)
[facility]-EMERGENCY DEPT Provider Note   CSN: [id number] Arrival date & time: 04/08/19  1605     History   Chief Complaint Chief Complaint  Patient presents with  . Palpitations  . Chest Pain    HPI Jennifer Smith is a 23 y.o. female.     Patient presents to the emergency department with a chief complaint of palpitations.  She reports that the symptoms started on Sunday.  She states that they have been intermittent for the past several days.  She states that she was seen by an urgent care and had reassuring results.  She was also seen by her primary care doctor and was diagnosed with gastritis.  She has been treated with omeprazole.  She states that she has been under a lot of stress and believes that she has anxiety, but no diagnosis has been made.  She denies any history of PE or DVT.  She is on birth control.  She also reports having several recent long drives.  She denies any calf swelling or leg pain.  The history is provided by the patient. No language interpreter was used.    Past Medical History:  Diagnosis Date  . Acid reflux   . Migraine     There are no active problems to display for this patient.   Past Surgical History:  Procedure Laterality Date  . WISDOM TOOTH EXTRACTION       OB History   No obstetric history on file.      Home Medications    Prior to Admission medications   Medication Sig Start Date End Date Taking? Authorizing Provider  Black Elderberry (SAMBUCUS ELDERBERRY PO) Take 1 tablet by mouth daily.   Yes [provider]  cetirizine (ZYRTEC) 10 MG tablet Take 10 mg by mouth at bedtime. 04/07/19  Yes [provider]  fluticasone (FLONASE) 50 MCG/ACT nasal spray Place 1 spray into both nostrils daily. 04/07/19  Yes [provider]  Terri Piedra 91-Day (SEASONALE PO) Take 1 tablet by mouth daily.   Yes [provider]  omeprazole (PRILOSEC) 20 MG capsule Take 20 mg by mouth  daily. 04/07/19  Yes [provider]  pediatric multivitamin-iron (POLY-VI-SOL WITH IRON) 15 MG chewable tablet Chew 2 tablets by mouth daily.    Yes [provider]  omeprazole (PRILOSEC) 40 MG capsule Take 1 capsule (40 mg total) by mouth daily. Patient not taking: Reported on 04/08/2019 09/03/17   Sammuel Cooper, PA-C    Family History Family History  Problem Relation Age of Onset  . Diabetes Paternal Grandmother   . Colon cancer Neg Hx   . Esophageal cancer Neg Hx   . Stomach cancer Neg Hx     Social History Social History   Tobacco Use  . Smoking status: Never Smoker  . Smokeless tobacco: Never Used  Substance Use Topics  . Alcohol use: No    Frequency: Never  . Drug use: Yes    Types: Marijuana     Allergies   Patient has no known allergies.   Review of Systems Review of Systems  All other systems reviewed and are negative.    Physical Exam Updated Vital Signs BP 128/88 (BP Location: Left Arm)   Pulse 80   Temp 99.9 F (37.7 C) (Oral)   Resp 12   LMP 02/06/2019 (Within Days)   SpO2 100%   Physical Exam Vitals signs and nursing note reviewed.  Constitutional:      General: She  is not in acute distress.    Appearance: She is well-developed.  HENT:     Head: Normocephalic and atraumatic.  Eyes:     Conjunctiva/sclera: Conjunctivae normal.  Neck:     Musculoskeletal: Neck supple.  Cardiovascular:     Rate and Rhythm: Normal rate and regular rhythm.     Heart sounds: No murmur.  Pulmonary:     Effort: Pulmonary effort is normal. No respiratory distress.     Breath sounds: Normal breath sounds.  Abdominal:     Palpations: Abdomen is soft.     Tenderness: There is no abdominal tenderness.  Musculoskeletal: Normal range of motion.  Skin:    General: Skin is warm and dry.  Neurological:     Mental Status: She is alert and oriented to person, place, and time.  Psychiatric:        Mood and Affect: Mood normal.        Behavior:  Behavior normal.      ED Treatments / Results  Labs (all labs ordered are listed, but only abnormal results are displayed) Labs Reviewed  BASIC METABOLIC PANEL - Abnormal; Notable for the following components:      Result Value   Creatinine, Ser 1.11 (*)    All other components within normal limits  CBC - Abnormal; Notable for the following components:   WBC 11.8 (*)    RBC 5.44 (*)    MCV 76.5 (*)    MCH 23.2 (*)    All other components within normal limits  D-DIMER, QUANTITATIVE (NOT AT [facility])  I-STAT BETA HCG BLOOD, ED ([facility], [facility], [facility] ONLY)  I-STAT BETA HCG BLOOD, ED (NOT ORDERABLE)  TROPONIN I (HIGH SENSITIVITY)  TROPONIN I (HIGH SENSITIVITY)    EKG None  Radiology Dg Chest 2 View  Result Date: 04/08/2019 CLINICAL DATA:  Chest pain EXAM: CHEST - 2 VIEW COMPARISON:  None. FINDINGS: Normal heart size. Normal mediastinal contour. No pneumothorax. No pleural effusion. Lungs appear clear, with no acute consolidative airspace disease and no pulmonary edema. IMPRESSION: No active cardiopulmonary disease. Electronically Signed   By: Ilona Sorrel M.D.   On: 04/08/2019 16:54    Procedures Procedures (including critical care time)  Medications Ordered in ED Medications  sodium chloride flush (NS) 0.9 % injection 3 mL (3 mLs Intravenous Not Given 04/08/19 2142)     Initial Impression / Assessment and Plan / ED Course  I have reviewed the triage vital signs and the nursing notes.  Pertinent labs & imaging results that were available during my care of the patient were reviewed by me and considered in my medical decision making (see chart for details).        Patient with palpitations times several days.  EKG is reassuring, no arrhythmias.  Laboratory work-up is also normal save for a nonspecific leukocytosis of 11.8.  She was tested for coronavirus 2 days ago, and was negative.  She denies any cough or fever.  She denies any other associated symptoms.   D-dimer is negative.   Remaining labs are all reassuring.  Recommend PCP/cardiology follow-up.  Consider Holter monitor.  Patient is stable and ready for discharge.  Final Clinical Impressions(s) / ED Diagnoses   Final diagnoses:  Palpitations    ED Discharge Orders    None       Montine Circle, PA-C 04/09/19 0103    Drenda Freeze, MD 04/13/19 [phone]

## 2019-04-08 NOTE — ED Triage Notes (Signed)
Pt reports having chest pains and heart racing since Sunday. Was seen at her doctor on Monday and told she has gastritis. Reports smokes marijuana daily.

## 2019-04-08 NOTE — ED Notes (Signed)
OBSERVED PT LEAVING. PT STATING SHE WAS OUTSIDE EATING. PT MADE AWARE OF BED STATUS

## 2019-04-08 NOTE — ED Notes (Signed)
Patient has extra blood in the main lab.  One gold top and one blue top

## 2019-04-09 LAB — D-DIMER, QUANTITATIVE: D-Dimer, Quant: 0.4 ug/mL-FEU (ref 0.00–0.50)

## 2019-04-09 NOTE — Discharge Instructions (Addendum)
Your blood test to assess your risk for blood clot came back normal.  The palpitations you are experiencing, can be brought on by anxiety, stress, as well as for other reasons.  We would like for you to be seen by cardiology.  Please contact the cardiology group listed above.  The remainder of your tests in the emergency department all looked fine.  Continue to take the omeprazole.  If your symptoms change or worsen, return to the ER.

## 2019-04-09 NOTE — ED Notes (Signed)
Patient ambulated to restroom with no assistance and steady gait.  

## 2019-04-12 ENCOUNTER — Encounter: Payer: Self-pay | Admitting: Cardiology

## 2019-04-13 ENCOUNTER — Ambulatory Visit (INDEPENDENT_AMBULATORY_CARE_PROVIDER_SITE_OTHER): Admitting: Cardiology

## 2019-04-13 ENCOUNTER — Other Ambulatory Visit: Payer: Self-pay

## 2019-04-13 ENCOUNTER — Encounter: Payer: Self-pay | Admitting: Cardiology

## 2019-04-13 VITALS — BP 110/70 | HR 80 | Ht 64.5 in | Wt 118.0 lb

## 2019-04-13 DIAGNOSIS — R002 Palpitations: Secondary | ICD-10-CM | POA: Diagnosis not present

## 2019-04-13 NOTE — Progress Notes (Signed)
Cardiology Office Note:    Date:  04/13/2019   ID:  Jennifer Smith, DOB [birth year], MRN [id number]  PCP:  Patient, No Pcp Per  Cardiologist:  No primary care provider on file.  Electrophysiologist:  None   Referring MD: No ref. provider found     History of Present Illness:    Jennifer Smith is a 23 y.o. female here for the evaluation of palpitations.  She was in the emergency room on 04/08/2019-been intermittent.  Primary care doctor had previously diagnosed her with some gastritis.  Omeprazole.  She been under a lot of stress and anxiety.  No prior history of PE DVT.  D-dimer was negative.  Coronavirus test was negative.  Her heart rate does feel fast when it seems to accompany anxiety.  No higher symptoms such as syncope or chest pain.  There is no specific time of day that this seems to occur.  With or without activity.  Does not realize if it is abrupt on or off.  No smoking, no alcohol no stimulants.  No early family history of sudden cardiac death or chest pain.  Mom went to heart MD because of click several years ago.   Past Medical History:  Diagnosis Date  . Acid reflux   . Migraine     Past Surgical History:  Procedure Laterality Date  . WISDOM TOOTH EXTRACTION      Current Medications: Current Meds  Medication Sig  . Black Elderberry (SAMBUCUS ELDERBERRY PO) Take 1 tablet by mouth daily.  . cetirizine (ZYRTEC) 10 MG tablet Take 10 mg by mouth at bedtime.  . fluticasone (FLONASE) 50 MCG/ACT nasal spray Place 1 spray into both nostrils daily.  [name] 91-Day (SEASONALE PO) Take 1 tablet by mouth daily.  Marland Kitchen omeprazole (PRILOSEC) 20 MG capsule Take 20 mg by mouth daily.  . pediatric multivitamin-iron (POLY-VI-SOL WITH IRON) 15 MG chewable tablet Chew 2 tablets by mouth daily.      Allergies:   Patient has no known allergies.   Social History   Socioeconomic History  . Marital status: Single    Spouse name: Not on file  . Number of children:  Not on file  . Years of education: Not on file  . Highest education level: Not on file  Occupational History  . Not on file  Social Needs  . Financial resource strain: Not on file  . Food insecurity    Worry: Not on file    Inability: Not on file  . Transportation needs    Medical: Not on file    Non-medical: Not on file  Tobacco Use  . Smoking status: Never Smoker  . Smokeless tobacco: Never Used  Substance and Sexual Activity  . Alcohol use: No    Frequency: Never  . Drug use: Yes    Types: Marijuana  . Sexual activity: Not on file  Lifestyle  . Physical activity    Days per week: Not on file    Minutes per session: Not on file  . Stress: Not on file  Relationships  . Social Herbalist on phone: Not on file    Gets together: Not on file    Attends religious service: Not on file    Active member of club or organization: Not on file    Attends meetings of clubs or organizations: Not on file    Relationship status: Not on file  Other Topics Concern  . Not on file  Social History Narrative  .  Not on file     Family History: The patient's family history includes Diabetes in her paternal grandmother. There is no history of Colon cancer, Esophageal cancer, or Stomach cancer.  ROS:   Please see the history of present illness.    Denies any fevers chills nausea vomiting syncope bleeding all other systems reviewed and are negative.  EKGs/Labs/Other Studies Reviewed:    The following studies were reviewed today: Chest x-ray 04/08/2019-no active disease.  EKG:  EKG is ordered today.  The ekg ordered today demonstrates sinus rhythm 80 with no other abnormalities.  Sinus rhythm with no other significant abnormalities personally reviewed from 04/09/2019.  Recent Labs: 04/08/2019: BUN 10; Creatinine, Ser 1.11; Hemoglobin 12.6; Platelets 390; Potassium 3.6; Sodium 138  Recent Lipid Panel No results found for: CHOL, TRIG, HDL, CHOLHDL, VLDL, LDLCALC, LDLDIRECT   Physical Exam:    VS:  BP 110/70   Pulse 80   Ht 5' 4.5" (1.638 m)   Wt 118 lb (53.5 kg)   SpO2 99%   BMI 19.94 kg/m     Wt Readings from Last 3 Encounters:  04/13/19 118 lb (53.5 kg)  10/28/17 123 lb (55.8 kg) (40 %, Z= -0.25)*  10/13/17 123 lb (55.8 kg) (40 %, Z= -0.25)*   * Growth percentiles are based on CDC (Girls, 2-20 Years) data.     GEN:  Well nourished, well developed in no acute distress HEENT: Normal NECK: No JVD; No carotid bruits LYMPHATICS: No lymphadenopathy CARDIAC: RRR, no murmurs, rubs, gallops RESPIRATORY:  Clear to auscultation without rales, wheezing or rhonchi  ABDOMEN: Soft, non-tender, non-distended MUSCULOSKELETAL:  No edema; No deformity  SKIN: Warm and dry NEUROLOGIC:  Alert and oriented x 3 PSYCHIATRIC:  Normal affect   ASSESSMENT:    1. Palpitations    PLAN:    In order of problems listed above:  Palpitations -Often accompanies anxiety, may just be sinus tachycardia as a natural response.  However, I will check a Zio patch monitor to ensure that she is not having any adverse or dangerous arrhythmias.  She has not had any higher symptoms such as syncope.  No chest discomfort with these situations.  I will check a TSH and a free T4 to ensure that there is no evidence of thyroid dysfunction.  I will also check her lipid panel for baseline.  All of her other blood work looked normal in the emergency room.  She does not have any murmurs on exam.  No signs of cardiomegaly on chest x-ray.  EKG is normal.     Medication Adjustments/Labs and Tests Ordered: Current medicines are reviewed at length with the patient today.  Concerns regarding medicines are outlined above.  Orders Placed This Encounter  Procedures  . TSH  . T4, free  . Lipid panel  . LONG TERM MONITOR (3-14 DAYS)  . EKG 12-Lead   No orders of the defined types were placed in this encounter.   Patient Instructions  Medication Instructions:  The current medical regimen is  effective;  continue present plan and medications.  *If you need a refill on your cardiac medications before your next appointment, please call your pharmacy*  Lab Work: Please have blood work today. (TSH, Free T4, Lipid) If you have labs (blood work) drawn today and your tests are completely normal, you will receive your results only by: Marland Kitchen MyChart Message (if you have MyChart) OR . A paper copy in the mail If you have any lab test that is abnormal or  we need to change your treatment, we will call you to review the results.  Testing/Procedures: Your physician has recommended that you wear a holter monitor for 14 days. Holter monitors are medical devices that record the heart's electrical activity. Doctors most often use these monitors to diagnose arrhythmias. Arrhythmias are problems with the speed or rhythm of the heartbeat. The monitor is a small, portable device. You can wear one while you do your normal daily activities. This is usually used to diagnose what is causing palpitations/syncope (passing out).  Follow-Up: Your next appointment:   Follow up will be determined after the above testing has been completed.  Thank you for choosing [facility]!!        Signed, Donato SchultzMark Skains, MD  04/13/2019 5:13 PM    [facility]

## 2019-04-13 NOTE — Patient Instructions (Addendum)
Medication Instructions:  The current medical regimen is effective;  continue present plan and medications.  *If you need a refill on your cardiac medications before your next appointment, please call your pharmacy*  Lab Work: Please have blood work today. (TSH, Free T4, Lipid) If you have labs (blood work) drawn today and your tests are completely normal, you will receive your results only by: Marland Kitchen MyChart Message (if you have MyChart) OR . A paper copy in the mail If you have any lab test that is abnormal or we need to change your treatment, we will call you to review the results.  Testing/Procedures: Your physician has recommended that you wear a holter monitor for 14 days. Holter monitors are medical devices that record the heart's electrical activity. Doctors most often use these monitors to diagnose arrhythmias. Arrhythmias are problems with the speed or rhythm of the heartbeat. The monitor is a small, portable device. You can wear one while you do your normal daily activities. This is usually used to diagnose what is causing palpitations/syncope (passing out).  Follow-Up: Your next appointment:   Follow up will be determined after the above testing has been completed.  Thank you for choosing [facility]!!

## 2019-04-14 LAB — LIPID PANEL
Chol/HDL Ratio: 2.9 ratio (ref 0.0–4.4)
Cholesterol, Total: 134 mg/dL (ref 100–199)
HDL: 47 mg/dL (ref >39)
LDL Chol Calc (NIH): 74 mg/dL (ref 0–99)
Triglycerides: 59 mg/dL (ref 0–149)
VLDL Cholesterol Cal: 13 mg/dL (ref 5–40)

## 2019-04-14 LAB — T4, FREE: Free T4: 1.57 ng/dL (ref 0.82–1.77)

## 2019-04-14 LAB — TSH: TSH: 0.824 u[IU]/mL (ref 0.450–4.500)

## 2019-04-26 ENCOUNTER — Telehealth: Payer: Self-pay

## 2019-04-26 ENCOUNTER — Ambulatory Visit: Admitting: Cardiovascular Disease

## 2019-04-26 NOTE — Telephone Encounter (Signed)
Spoke to pt, went over monitor instructions. She is currently in [place] and would like the monitor mailed to that address:  [address] [place] [address]  14 day ZIO ordered.

## 2019-04-30 NOTE — Telephone Encounter (Signed)
Patient called today stating that she still has not received her monitor.  It was shipped out on Tuesday.

## 2019-05-02 ENCOUNTER — Ambulatory Visit (INDEPENDENT_AMBULATORY_CARE_PROVIDER_SITE_OTHER)

## 2019-05-02 DIAGNOSIS — R002 Palpitations: Secondary | ICD-10-CM

## 2019-05-03 NOTE — Telephone Encounter (Signed)
ZIO called. Monitor delivered 05/01/2019 4:38 PM to parcel locker.  Called patient to confirm.

## 2019-05-06 ENCOUNTER — Other Ambulatory Visit: Payer: Self-pay | Admitting: Internal Medicine

## 2019-05-06 DIAGNOSIS — E049 Nontoxic goiter, unspecified: Secondary | ICD-10-CM

## 2019-05-18 ENCOUNTER — Other Ambulatory Visit

## 2019-06-01 ENCOUNTER — Ambulatory Visit
Admission: RE | Admit: 2019-06-01 | Discharge: 2019-06-01 | Disposition: A | Discharge status: Home / Self Care | Source: Ambulatory Visit | Attending: Internal Medicine | Admitting: Internal Medicine

## 2019-06-01 DIAGNOSIS — E049 Nontoxic goiter, unspecified: Secondary | ICD-10-CM

## 2019-08-19 ENCOUNTER — Ambulatory Visit: Attending: Family

## 2019-08-19 DIAGNOSIS — Z23 Encounter for immunization: Secondary | ICD-10-CM | POA: Insufficient documentation

## 2019-08-19 NOTE — Progress Notes (Signed)
   Covid-19 Vaccination Clinic  Name:  Allison Deshotels    MRN: [id number] DOB: 09-11-1995  08/19/2019  Ms. Spilker was observed post Covid-19 immunization for 15 minutes without incident. She was provided with Vaccine Information Sheet and instruction to access the V-Safe system.   Ms. Salonga was instructed to call 911 with any severe reactions post vaccine: Marland Kitchen Difficulty breathing  . Swelling of face and throat  . A fast heartbeat  . A bad rash all over body  . Dizziness and weakness   Immunizations Administered    Name Date Dose VIS Date Route   Moderna COVID-19 Vaccine 08/19/2019  1:14 PM 0.5 mL 05/18/2019 Intramuscular   Manufacturer: Moderna   Lot: [id number]   NDC: 31281-188-67

## 2019-09-21 ENCOUNTER — Ambulatory Visit: Attending: Family

## 2019-09-21 DIAGNOSIS — Z23 Encounter for immunization: Secondary | ICD-10-CM

## 2019-09-21 NOTE — Progress Notes (Signed)
   Covid-19 Vaccination Clinic  Name:  Jennifer Smith    MRN: [id number] DOB: Sep 04, 1995  09/21/2019  [name] was observed post Covid-19 immunization for 15 minutes without incident. She was provided with Vaccine Information Sheet and instruction to access the V-Safe system.   [name] was instructed to call 911 with any severe reactions post vaccine: Marland Kitchen Difficulty breathing  . Swelling of face and throat  . A fast heartbeat  . A bad rash all over body  . Dizziness and weakness   Immunizations Administered    Name Date Dose VIS Date Route   Moderna COVID-19 Vaccine 09/21/2019  2:04 PM 0.5 mL 05/18/2019 Intramuscular   Manufacturer: Moderna   Lot: [id number]   NDC: 11021-117-35

## 2021-05-12 ENCOUNTER — Ambulatory Visit
Admission: EM | Admit: 2021-05-12 | Discharge: 2021-05-12 | Disposition: A | Discharge status: Home / Self Care | Payer: BC Managed Care – PPO | Attending: Physician Assistant | Admitting: Physician Assistant

## 2021-05-12 ENCOUNTER — Other Ambulatory Visit: Payer: Self-pay

## 2021-05-12 DIAGNOSIS — J069 Acute upper respiratory infection, unspecified: Secondary | ICD-10-CM | POA: Diagnosis not present

## 2021-05-12 IMAGING — CR DG CHEST 2V
2 series · 2 of 2 positions shown · non-contrast
Comparison: None.

CLINICAL DATA: Chest pain

EXAM:
CHEST - 2 VIEW

[w chest pa]
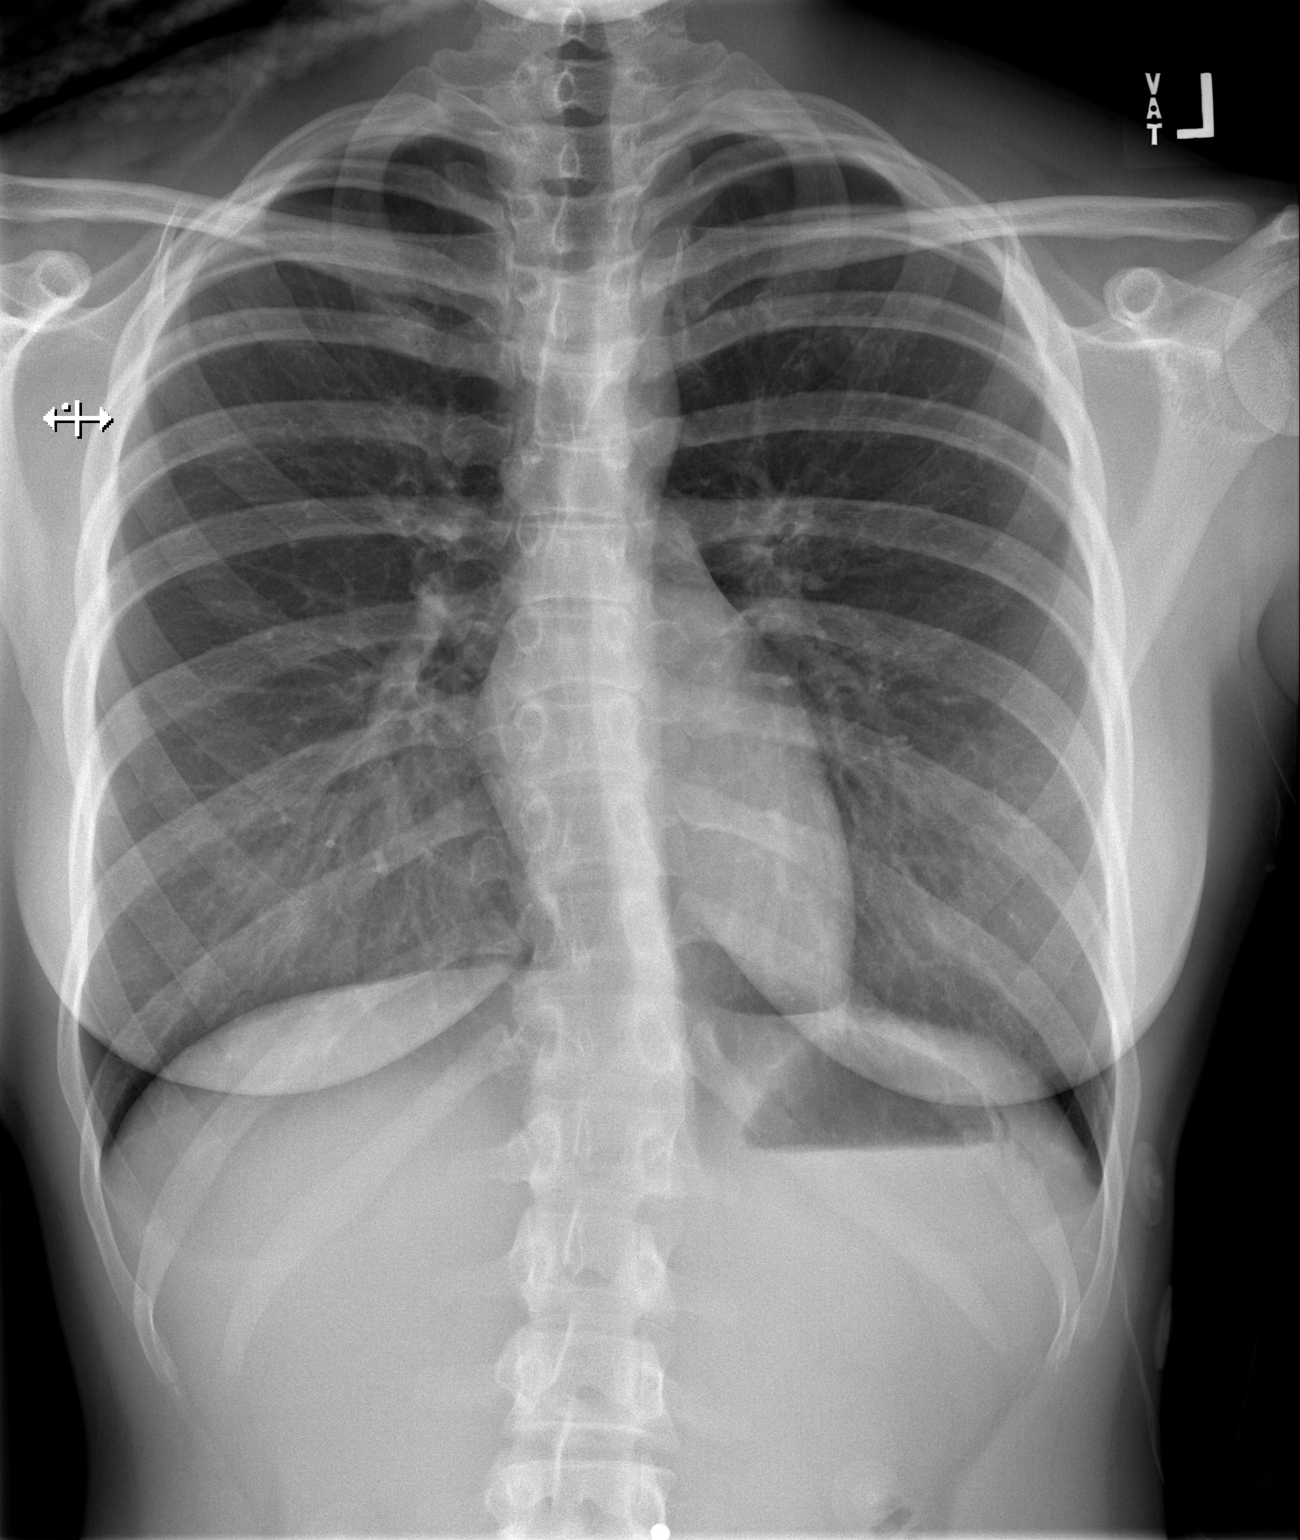

[w chest lat]
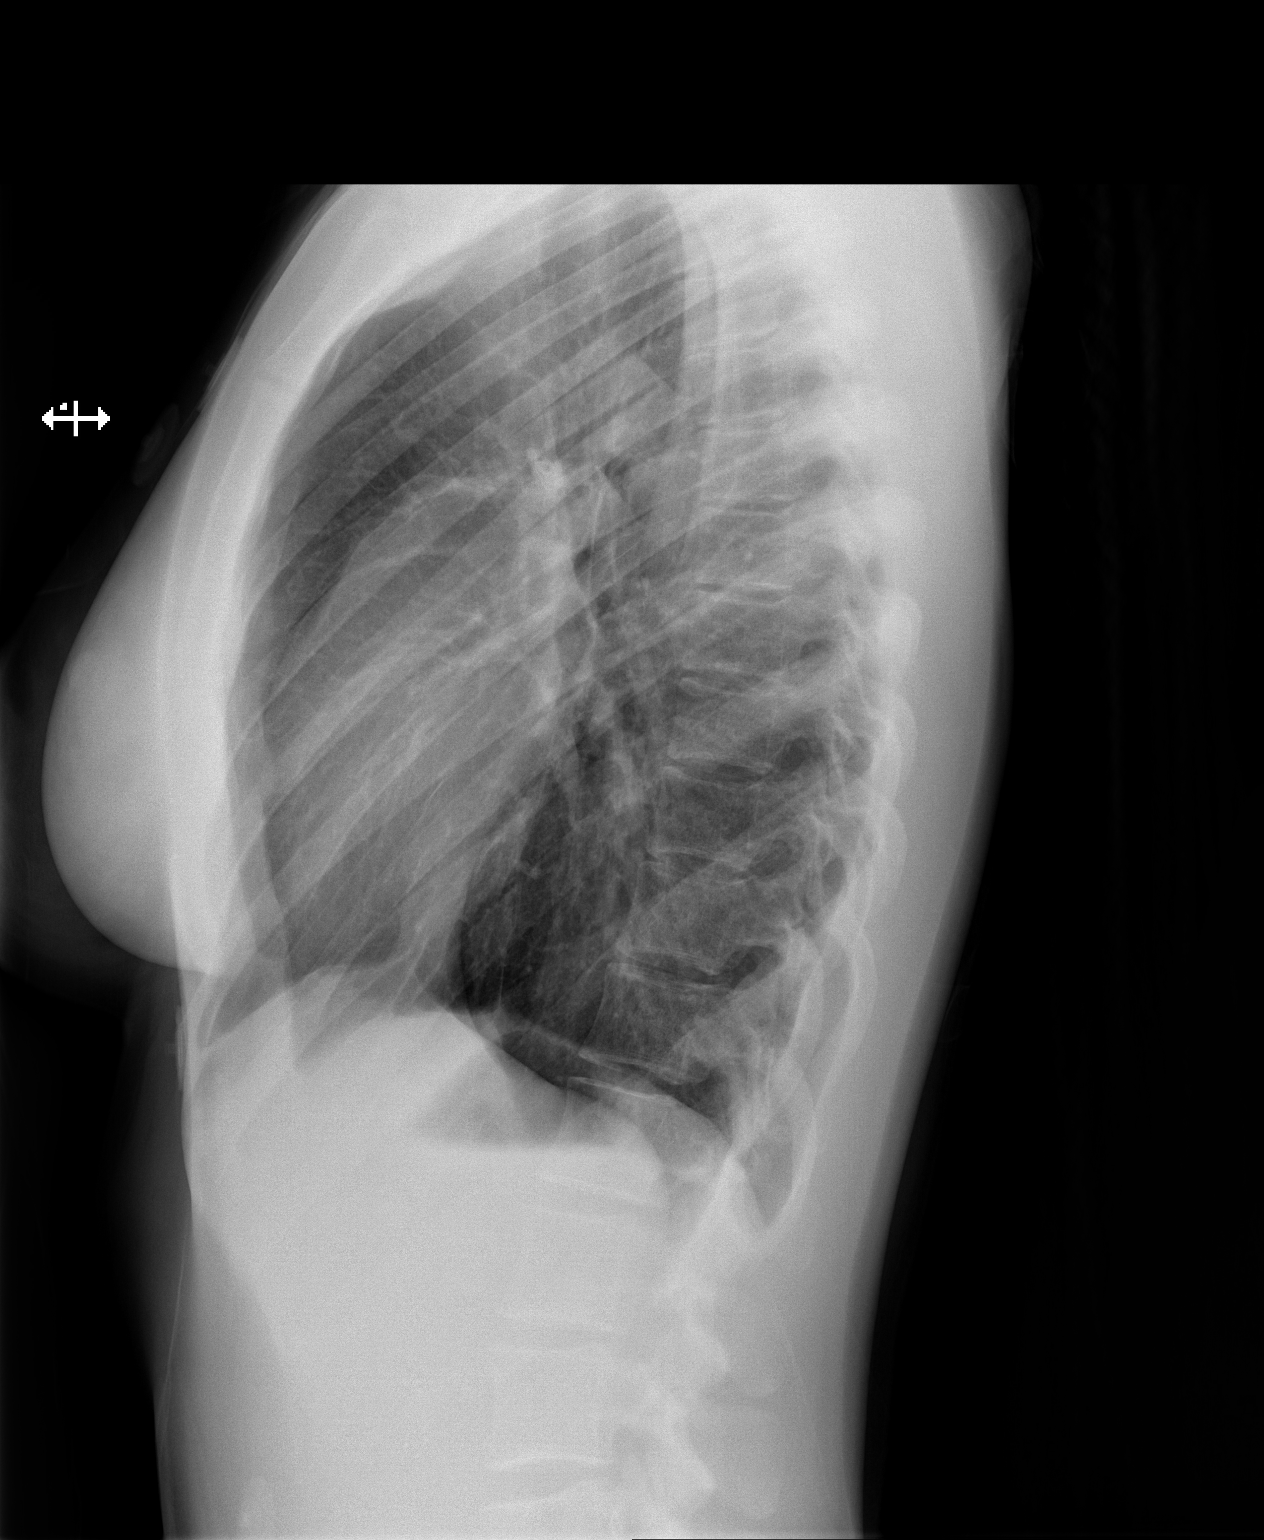

[2 of 2 positions shown; findings below may reference images not displayed]

FINDINGS: Normal heart size. Normal mediastinal contour. No pneumothorax. No
pleural effusion. Lungs appear clear, with no acute consolidative
airspace disease and no pulmonary edema.
IMPRESSION: No active cardiopulmonary disease.

## 2021-05-12 NOTE — ED Triage Notes (Signed)
4 day h/o cough and congestion, 3 days diarrhea and onset this morning of HA. Emesis x4. Has been taking nyquil and dayquil w/o relief. Seen by PCP 4 day ago, dx with cold and was advised to tx with otc meds per Mom. Has been taking tylenol for HA w/o relief.

## 2021-05-12 NOTE — ED Provider Notes (Signed)
[facility]    CSN: [id number] Arrival date & time: 05/12/21  1147      History   Chief Complaint Chief Complaint  Patient presents with   Cough   Diarrhea    HPI Jennifer Smith is a 25 y.o. female.   Patient here today for evaluation of cough, congestion that she has had for 4 days.  She reports she is also had some diarrhea that started 3 days ago.  This morning she developed headache.  She has had 4 episodes of vomiting.  She has tried DayQuil and NyQuil without relief.  4 days ago she saw her PCP and was diagnosed with upper respiratory infection and was screened at that time for flu and COVID.  Rapid screening was negative.  She has been taking Tylenol as well without significant relief.  The history is provided by the patient.  Cough Associated symptoms: sore throat   Associated symptoms: no chills, no ear pain, no eye discharge, no fever, no shortness of breath and no wheezing   Diarrhea Associated symptoms: no abdominal pain, no chills, no fever and no vomiting    Past Medical History:  Diagnosis Date   Acid reflux    Migraine     There are no problems to display for this patient.   Past Surgical History:  Procedure Laterality Date   WISDOM TOOTH EXTRACTION      OB History   No obstetric history on file.      Home Medications    Prior to Admission medications   Medication Sig Start Date End Date Taking? Authorizing Provider  Black Elderberry (SAMBUCUS ELDERBERRY PO) Take 1 tablet by mouth daily.    [provider]  cetirizine (ZYRTEC) 10 MG tablet Take 10 mg by mouth at bedtime. 04/07/19   [provider]  fluticasone (FLONASE) 50 MCG/ACT nasal spray Place 1 spray into both nostrils daily. 04/07/19   [provider]  Levonorgest-Eth Estrad 91-Day (SEASONALE PO) Take 1 tablet by mouth daily.    [provider]  omeprazole (PRILOSEC) 20 MG capsule Take 20 mg by mouth daily. 04/07/19   [provider]  pediatric multivitamin-iron (POLY-VI-SOL WITH IRON) 15 MG chewable tablet Chew 2 tablets by mouth daily.     [provider]    Family History Family History  Problem Relation Age of Onset   Diabetes Paternal Grandmother    Colon cancer Neg Hx    Esophageal cancer Neg Hx    Stomach cancer Neg Hx     Social History Social History   Tobacco Use   Smoking status: Never   Smokeless tobacco: Never  Vaping Use   Vaping Use: Never used  Substance Use Topics   Alcohol use: No   Drug use: Yes    Types: Marijuana     Allergies   Patient has no known allergies.   Review of Systems Review of Systems  Constitutional:  Negative for chills and fever.  HENT:  Positive for congestion, sinus pressure and sore throat. Negative for ear pain.   Eyes:  Negative for discharge and redness.  Respiratory:  Positive for cough. Negative for shortness of breath and wheezing.   Gastrointestinal:  Positive for diarrhea. Negative for abdominal pain, nausea and vomiting.    Physical Exam Triage Vital Signs ED Triage Vitals  Enc Vitals Group     BP 05/12/21 1237 121/81     Pulse Rate 05/12/21 1237 (!) 102     Resp 05/12/21  1237 18     Temp 05/12/21 1237 98.7 F (37.1 C)     Temp Source 05/12/21 1237 Oral     SpO2 05/12/21 1237 98 %     Weight --      Height --      Head Circumference --      Peak Flow --      Pain Score 05/12/21 1241 4     Pain Loc --      Pain Edu? --      Excl. in GC? --    No data found.  Updated Vital Signs BP 121/81 (BP Location: Left Arm)   Pulse (!) 102   Temp 98.7 F (37.1 C) (Oral)   Resp 18   SpO2 98%     Physical Exam Vitals and nursing note reviewed.  Constitutional:      General: She is not in acute distress.    Appearance: Normal appearance. She is not ill-appearing.  HENT:     Head: Normocephalic and atraumatic.     Right Ear: Tympanic membrane normal.     Left Ear: Tympanic membrane normal.     Nose: Congestion present.      Mouth/Throat:     Mouth: Mucous membranes are moist.     Pharynx: No oropharyngeal exudate or posterior oropharyngeal erythema.  Eyes:     Conjunctiva/sclera: Conjunctivae normal.  Cardiovascular:     Rate and Rhythm: Normal rate and regular rhythm.     Heart sounds: Normal heart sounds. No murmur heard. Pulmonary:     Effort: Pulmonary effort is normal. No respiratory distress.     Breath sounds: Normal breath sounds. No wheezing, rhonchi or rales.  Skin:    General: Skin is warm and dry.  Neurological:     Mental Status: She is alert.  Psychiatric:        Mood and Affect: Mood normal.        Thought Content: Thought content normal.     UC Treatments / Results  Labs (all labs ordered are listed, but only abnormal results are displayed) Labs Reviewed  COVID-19, FLU A+B NAA    EKG   Radiology No results found.  Procedures Procedures (including critical care time)  Medications Ordered in UC Medications - No data to display  Initial Impression / Assessment and Plan / UC Course  I have reviewed the triage vital signs and the nursing notes.  Pertinent labs & imaging results that were available during my care of the patient were reviewed by me and considered in my medical decision making (see chart for details).    Suspect likely viral etiology of symptoms will order COVID and flu PCR screening.  Discussed possibility of developing bronchitis as well and recommend steroid burst if symptoms persist and COVID is negative. Encouraged symptomatic treatment in the meantime.   Recommend follow-up with any further concerns  Final Clinical Impressions(s) / UC Diagnoses   Final diagnoses:  Acute upper respiratory infection   Discharge Instructions   None    ED Prescriptions   None    PDMP not reviewed this encounter.   Tomi Bamberger, PA-C 05/12/21 1301

## 2021-05-13 LAB — COVID-19, FLU A+B NAA
Influenza A, NAA: DETECTED — AB
Influenza B, NAA: NOT DETECTED
SARS-CoV-2, NAA: NOT DETECTED

## 2021-05-14 ENCOUNTER — Telehealth (HOSPITAL_COMMUNITY): Payer: Self-pay | Admitting: Emergency Medicine

## 2021-05-14 NOTE — Telephone Encounter (Signed)
Opened in error

## 2021-05-20 ENCOUNTER — Telehealth: Payer: Self-pay

## 2021-05-20 NOTE — Telephone Encounter (Signed)
RN attempted to return phone call for something to do with work note for recent encounter. Call sent to VM.

## 2021-05-29 ENCOUNTER — Other Ambulatory Visit: Payer: Self-pay | Admitting: Family Medicine

## 2021-05-29 DIAGNOSIS — R7401 Elevation of levels of liver transaminase levels: Secondary | ICD-10-CM

## 2021-06-25 ENCOUNTER — Ambulatory Visit: Payer: BC Managed Care – PPO | Admitting: Psychiatry

## 2021-06-25 ENCOUNTER — Encounter: Payer: Self-pay | Admitting: *Deleted

## 2021-06-25 VITALS — BP 124/82 | HR 90 | Ht 62.0 in | Wt 134.0 lb

## 2021-06-25 DIAGNOSIS — G43009 Migraine without aura, not intractable, without status migrainosus: Secondary | ICD-10-CM | POA: Diagnosis not present

## 2021-06-25 DIAGNOSIS — G43909 Migraine, unspecified, not intractable, without status migrainosus: Secondary | ICD-10-CM | POA: Insufficient documentation

## 2021-06-25 MED ORDER — ZOLMITRIPTAN 5 MG PO TABS: 5.0000 mg | ORAL_TABLET | ORAL | 3 refills | Status: DC | PRN

## 2021-06-25 NOTE — Progress Notes (Signed)
Referring:  Audley Hose, MD [address] [place],  [place] [address]  PCP: Pcp, No  Neurology was asked to evaluate Jennifer Smith, a 26 year old female for a chief complaint of headaches.  Our recommendations of care will be communicated by shared medical record.    CC:  headaches  HPI:  Medical co-morbidities: CKD 2  The patient presents for evaluation of migraines which have been present since she was a child. They are described as holocephalic throbbing pain with associated photophobia and phonophobia. They can last several hours at a time. Migraines are mostly associated with her menstrual cycle but can occur other times as well. She is on birth control so she only gets a period every 3 months.  Typically she has 1-2 migraines per week. She did recently have a period where she had a headache daily for 4 weeks straight.  Previously tried Excedrin which was ineffective. Tylenol but had to stop due to LFTs. Imitrex made her vomit. Now doesn't take anything as needed.  Headache History: Onset: childhood Triggers: menstrual cycle, stress, fasting, fasting Aura: none Location: anywhere Quality/Description: throbbing, aching Associated Symptoms:  Photophobia: yes  Phonophobia: yes  Nausea: no Vomiting: no Worse with activity?: yes Duration of headaches: several hours  Birth control: OCPs  Headache days per month: 8 Headache free days per month: 22  Current Treatment: Abortive none  Preventative none  Prior Therapies                                 Imitrex - vomiting Tylenol Excedrin  LABS: CBC    Component Value Date/Time   WBC 11.8 (H) 04/08/2019 1655   RBC 5.44 (H) 04/08/2019 1655   HGB 12.6 04/08/2019 1655   HCT 41.6 04/08/2019 1655   PLT 390 04/08/2019 1655   MCV 76.5 (L) 04/08/2019 1655   MCH 23.2 (L) 04/08/2019 1655   MCHC 30.3 04/08/2019 1655   RDW 13.9 04/08/2019 1655   LYMPHSABS 3.3 09/03/2017 1156   MONOABS 0.7 09/03/2017  1156   EOSABS 0.1 09/03/2017 1156   BASOSABS 0.1 09/03/2017 1156   CMP Latest Ref Rng & Units 04/08/2019 09/03/2017  Glucose 70 - 99 mg/dL 75 101(H)  BUN 6 - 20 mg/dL 10 13  Creatinine 0.44 - 1.00 mg/dL 1.11(H) 1.13  Sodium 135 - 145 mmol/L 138 144  Potassium 3.5 - 5.1 mmol/L 3.6 3.9  Chloride 98 - 111 mmol/L 107 106  CO2 22 - 32 mmol/L 22 24  Calcium 8.9 - 10.3 mg/dL 9.5 10.2  Total Protein 6.0 - 8.3 g/dL - 7.3  Total Bilirubin 0.2 - 1.2 mg/dL - 0.1(L)  Alkaline Phos 47 - 119 U/L - 77  AST 0 - 37 U/L - 15  ALT 0 - 35 U/L - 10     IMAGING:  MRI brain when she was 14-15 was reportedly normal  Current Outpatient Medications on File Prior to Visit  Medication Sig Dispense Refill   Levonorgest-Eth Estrad 91-Day (SEASONALE PO) Take 1 tablet by mouth daily.     pediatric multivitamin-iron (POLY-VI-SOL WITH IRON) 15 MG chewable tablet Chew 2 tablets by mouth daily.      No current facility-administered medications on file prior to visit.     Allergies: No Known Allergies  Family History: Migraine or other headaches in the family:  grandmother Aneurysms in a first degree relative:  no Brain tumors in the  family:  no Other neurological illness in the family:   no  Past Medical History: Past Medical History:  Diagnosis Date   Acid reflux    Migraine     Past Surgical History Past Surgical History:  Procedure Laterality Date   WISDOM TOOTH EXTRACTION      Social History: Social History   Tobacco Use   Smoking status: Never   Smokeless tobacco: Never  Vaping Use   Vaping Use: Never used  Substance Use Topics   Alcohol use: No   Drug use: Yes    Types: Marijuana     ROS: Negative for fevers, chills. Positive for headaches. All other systems reviewed and negative unless stated otherwise in HPI.   Physical Exam:   Vital Signs: BP 124/82    Pulse 90    Ht 5\' 2"  (1.575 m)    Wt 134 lb (60.8 kg)    SpO2 98%    BMI 24.51 kg/m  GENERAL: well appearing,in no  acute distress,alert SKIN:  Color, texture, turgor normal. No rashes or lesions HEAD:  Normocephalic/atraumatic. CV:  RRR RESP: Normal respiratory effort MSK: no tenderness to palpation over occiput, neck, or shoulders  NEUROLOGICAL: Mental Status: Alert, oriented to person, place and time,Follows commands Cranial Nerves: PERRL,visual fields intact to confrontation,extraocular movements intact,facial sensation intact,no facial droop or ptosis,hearing intact to finger rub bilaterally,no dysarthria Motor: muscle strength 5/5 both upper and lower extremities,no drift, normal tone Reflexes: 2+ throughout Sensation: intact to light touch all 4 extremities Coordination: Finger-to- nose-finger intact bilaterally Gait: normal-based   IMPRESSION: 26 year old female with a history of CKD 2 who presents for evaluation of migraines. Her headache pattern is consistent with episodic migraine without aura. Discussed treatment options including preventive and acute medications. She would prefer to avoid preventive medications at this time. Will start Zomig for rescue. Lifestyle information for migraine management including supplements, diet, and stress management information provided.  PLAN: -Rescue: Start Zomig 5 mg PRN -Supplement information provided -next steps: consider daily preventive if headache frequency increases  I spent a total of 28 minutes chart reviewing and counseling the patient. Headache education was done. Discussed treatment options including preventive and acute medications, and natural supplements. Discussed medication side effects, adverse reactions and drug interactions. Written educational materials and patient instructions outlining all of the above were given.  Follow-up: 3 months    Genia Harold, MD 06/25/2021   3:59 PM

## 2021-06-25 NOTE — Patient Instructions (Addendum)
Start Zomig as needed for migraines. Take at the onset of migraine. If headache recurs or does not fully resolve, you may take a second dose after 2 hours. Please avoid taking more than 2 days per week to avoid rebound headaches   Natural supplements: Magnesium Oxide or Magnesium Glycinate 500 mg at bed (up to 800 mg daily) Coenzyme Q10 300 mg in AM Vitamin B2- 200 mg twice a day  Add 1 supplement at a time since even natural supplements can have undesirable side effects. You can sometimes buy supplements cheaper (especially Coenzyme Q10) at www.https://compton-perez.com/ or at [employer].  Vitamins and herbs that show potential:  Magnesium: Magnesium (250 mg twice a day or 500 mg at bed) has a relaxant effect on smooth muscles such as blood vessels. Individuals suffering from frequent or daily headache usually have low magnesium levels which can be increase with daily supplementation of 400-750 mg. Three trials found 40-90% average headache reduction  when used as a preventative. Magnesium also demonstrated the benefit in menstrually related migraine.  Magnesium is part of the messenger system in the serotonin cascade and it is a good muscle relaxant.  It is also useful for constipation which can be a side effect of other medications used to treat migraine. Good sources include nuts, whole grains, and tomatoes. Side Effects: loose stool/diarrhea Riboflavin (vitamin B 2) 200 mg twice a day. This vitamin assists nerve cells in the production of ATP a principal energy storing molecule.  It is necessary for many chemical reactions in the body.  There have been at least 3 clinical trials of riboflavin using 400 mg per day all of which suggested that migraine frequency can be decreased.  All 3 trials showed significant improvement in over half of migraine sufferers.  The supplement is found in bread, cereal, milk, meat, and poultry.  Most Americans get more riboflavin than the recommended daily allowance, however riboflavin  deficiency is not necessary for the supplements to help prevent headache. Side effects: energizing, green urine  Coenzyme Q10: This is present in almost all cells in the body and is critical component for the conversion of energy.  Recent studies have shown that a nutritional supplement of CoQ10 can reduce the frequency of migraine attacks by improving the energy production of cells as with riboflavin.  Doses of 150 mg twice a day have been shown to be effective.   HEADACHE DIET: Foods and beverages which may trigger migraine Note that only 20% of headache patients are food sensitive. You will know if you are food sensitive if you get a headache consistently 20 minutes to 2 hours after eating a certain food. Only cut out a food if it causes headaches, otherwise you might remove foods you enjoy! What matters most for diet is to eat a well balanced healthy diet full of vegetables and low fat protein, and to not miss meals.  Chocolate, other sweets ALL cheeses except cottage and cream cheese Dairy products, yogurt, sour cream, ice cream Liver Meat extracts (Bovril, Marmite, meat tenderizers) Meats or fish which have undergone aging, fermenting, pickling or smoking. These include: Hotdogs,salami,Lox,sausage, mortadellas,smoked salmon, pepperoni, Pickled herring Pods of broad bean (English beans, Chinese pea pods, [place] (fava) beans, lima and navy beans Ripe avocado, ripe banana Yeast extracts or active yeast preparations such as Brewer's or Fleishman's (commercial bakes goods are permitted) Tomato based foods, pizza (lasagna, etc.)  MSG (monosodium glutamate) is disguised as many things; look for these common aliases: Monopotassium glutamate Autolysed yeast Hydrolysed  protein Sodium caseinate flavorings all natural preservatives" Nutrasweet  Avoid all other foods that convincingly provoke headaches.  Resources: The Dizzy Lu Duffel Your Headache Diet, migrainestrong.com   https://www.aguirre.org/  Caffeine and Migraine For patients that have migraine, caffeine intake more than 3 days per week can lead to dependency and increased migraine frequency. I would recommend cutting back on your caffeine intake as best you can. The recommended amount of caffeine is 200-300 mg daily, although migraine patients may experience dependency at even lower doses. While you may notice an increase in headache temporarily, cutting back will be helpful for headaches in the long run. For more information on caffeine and migraine, visit: https://americanmigrainefoundation.org/resource-library/caffeine-and-migraine/  Headache Prevention Strategies:  1. Maintain a headache diary; learn to identify and avoid triggers.  - This can be a simple note where you log when you had a headache, associated symptoms, and medications used - There are several smartphone apps developed to help track migraines: Migraine Buddy, Migraine Monitor, Curelator N1-Headache App  Common triggers include: Emotional triggers: Emotional/Upset family or friends Emotional/Upset occupation Business reversal/success Anticipation anxiety Crisis-serious Post-crisis periodNew job/position   Physical triggers: Vacation Day Weekend Strenuous Exercise High Altitude Location New Move Menstrual Day Physical Illness Oversleep/Not enough sleep Weather changes Light: Photophobia or light sesnitivity treatment involves a balance between desensitization and reduction in overly strong input. Use dark polarized glasses outside, but not inside. Avoid bright or fluorescent light, but do not dim environment to the point that going into a normally lit room hurts. Consider FL-41 tint lenses, which reduce the most irritating wavelengths without blocking too much light.  These can be obtained at axonoptics.com or theraspecs.com Foods: see list above.  2. Limit use of acute  treatments (over-the-counter medications, triptans, etc.) to no more than 2 days per week or 10 days per month to prevent medication overuse headache (rebound headache).    3. Follow a regular schedule (including weekends and holidays): Don't skip meals. Eat a balanced diet. 8 hours of sleep nightly. Minimize stress. Exercise 30 minutes per day. Being overweight is associated with a 5 times increased risk of chronic migraine. Keep well hydrated and drink 6-8 glasses of water per day.  4. Initiate non-pharmacologic measures at the earliest onset of your headache. Rest and quiet environment. Relax and reduce stress. Breathe2Relax is a free app that can instruct you on    some simple relaxtion and breathing techniques. Http://Dawnbuse.com is a    free website that provides teaching videos on relaxation.  Also, there are  many apps that   can be downloaded for mindful relaxation.  An app called YOGA NIDRA will help walk you through mindfulness. Another app called Calm can be downloaded to give you a structured mindfulness guide with daily reminders and skill development. Headspace for guided meditation Mindfulness Based Stress Reduction Online Course: www.palousemindfulness.com Cold compresses.  5. Don't wait!! Take the maximum allowable dosage of prescribed medication at the first sign of migraine.  6. Compliance:  Take prescribed medication regularly as directed and at the first sign of a migraine.  7. Communicate:  Call your physician when problems arise, especially if your headaches change, increase in frequency/severity, or become associated with neurological symptoms (weakness, numbness, slurred speech, etc.).  8. Headache/pain management therapies: Consider various complementary methods, including medication, behavioral therapy, psychological counselling, biofeedback, massage therapy, acupuncture, dry needling, and other modalities.  Such measures may reduce the need for medications.  Counseling for pain management, where patients learn to function and ignore/minimize their  pain, seems to work very well.  9. Recommend changing family's attention and focus away from patient's headaches. Instead, emphasize daily activities. If first question of day is 'How are your headaches/Do you have a headache today?', then patient will constantly think about headaches, thus making them worse. Goal is to re-direct attention away from headaches, toward daily activities and other distractions.  10. Helpful Websites: www.AmericanHeadacheSociety.org VoipObserver.it www.headaches.org GolfingFamily.no www.achenet.org

## 2021-06-26 ENCOUNTER — Ambulatory Visit
Admission: RE | Admit: 2021-06-26 | Discharge: 2021-06-26 | Disposition: A | Discharge status: Home / Self Care | Payer: BC Managed Care – PPO | Source: Ambulatory Visit | Attending: Family Medicine | Admitting: Family Medicine

## 2021-06-26 DIAGNOSIS — R7401 Elevation of levels of liver transaminase levels: Secondary | ICD-10-CM

## 2021-09-26 NOTE — Progress Notes (Signed)
? ?  CC:  headaches ? ?Follow-up Visit ? ?Last visit: 06/25/21 ? ?Brief HPI: ?26 year old female who follows in clinic for migraine with aura. ? ?At her last visit she was started on Zomig for rescue. ? ?Interval History: ?Since her last visit her headaches are about the same. She currently has 1-2 migraines per week. They are most common around her menstrual cycle but can occur other times as well. Sometimes she will get migraines if she is stressed or has skipped meals. She was unable to tolerate Zomig due to nausea. ? ?Headache days per month: 8 ?Headache free days per month: 12 ? ?Current Headache Regimen: ?Preventative: none ?Abortive: none ? ?Prior Therapies                                  ?Imitrex - vomiting ?Zomig - nausea ?Tylenol ?Excedrin ? ?Physical Exam:  ? ?Vital Signs: ?BP 111/74   Pulse 69   Ht 5\' 2"  (1.575 m)   Wt 126 lb 12.8 oz (57.5 kg)   BMI 23.19 kg/m?  ?GENERAL:  well appearing, in no acute distress, alert  ?SKIN:  Color, texture, turgor normal. No rashes or lesions ?HEAD:  Normocephalic/atraumatic. ?RESP: normal respiratory effort ?MSK:  No gross joint deformities.  ? ?NEUROLOGICAL: ?Mental Status: Alert, oriented to person, place and time, Follows commands, and Speech fluent and appropriate. ?Cranial Nerves: PERRL, face symmetric, no dysarthria, hearing grossly intact ?Motor: moves all extremities equally ?Gait: normal-based. ? ?IMPRESSION: ?26 year old female who presents for follow up of episodic migraines. She was unable to tolerate Zomig due to nausea. She has failed 2 triptans at this point due to side effects. Will start Ubrelvy for migraine rescue. She would prefer not to start a preventive medication at this time. ? ?PLAN: ?-Rescue: Start Ubrelvy 100 mg PRN ?-next steps: consider daily preventive if headache frequency increases ? ? ?Follow-up: 4 months ? ?I spent a total of 11 minutes on the date of the service. Headache education was done. Discussed lifestyle modification including  maintaining regular sleep and eating schedule. Discussed treatment options including acute medications.Discussed medication side effects, adverse reactions and drug interactions. Written educational materials and patient instructions outlining all of the above were given. ? ?30, MD ?09/27/21 ?9:23 AM ? ?

## 2021-09-27 ENCOUNTER — Telehealth: Payer: Self-pay | Admitting: *Deleted

## 2021-09-27 ENCOUNTER — Ambulatory Visit: Payer: BC Managed Care – PPO | Admitting: Psychiatry

## 2021-09-27 ENCOUNTER — Encounter: Payer: Self-pay | Admitting: Psychiatry

## 2021-09-27 VITALS — BP 111/74 | HR 69 | Ht 62.0 in | Wt 126.8 lb

## 2021-09-27 DIAGNOSIS — G43019 Migraine without aura, intractable, without status migrainosus: Secondary | ICD-10-CM

## 2021-09-27 MED ORDER — UBRELVY 100 MG PO TABS: 100.0000 mg | ORAL_TABLET | ORAL | 0 refills | Status: DC | PRN

## 2021-09-27 NOTE — Patient Instructions (Signed)
Start Monson as needed for migraine. Take one pill at onset of migraine, can repeat a dose in 2 hours. ?

## 2021-09-27 NOTE — Telephone Encounter (Signed)
Roselyn Meier PA, key [id number], G43.019. failed imitrex, zomig.  ? Your information has been submitted to [place] [employer]. To check for an updated outcome later, reopen this PA request from your dashboard.If [employer] has not responded to your request within 24 hours, contact [employer] at [phone]. ?

## 2021-10-01 ENCOUNTER — Encounter: Payer: Self-pay | Admitting: *Deleted

## 2021-10-01 NOTE — Telephone Encounter (Signed)
Bernita Raisin approved, As long as you remain covered by the [facility] and there are no changes to your plan benefits, this request is approved for the following time period: ?09/27/2021 - 09/28/2022. Sent patient my chart to advise. ?

## 2022-01-21 ENCOUNTER — Encounter: Payer: Self-pay | Admitting: Psychiatry

## 2022-01-21 ENCOUNTER — Ambulatory Visit: Payer: BC Managed Care – PPO | Admitting: Psychiatry

## 2022-01-21 VITALS — BP 115/71 | HR 72 | Ht 64.0 in | Wt 127.0 lb

## 2022-01-21 DIAGNOSIS — G43119 Migraine with aura, intractable, without status migrainosus: Secondary | ICD-10-CM

## 2022-01-21 DIAGNOSIS — R519 Headache, unspecified: Secondary | ICD-10-CM

## 2022-01-21 MED ORDER — PROCHLORPERAZINE MALEATE 10 MG PO TABS: 10.0000 mg | ORAL_TABLET | Freq: Four times a day (QID) | ORAL | 6 refills | Status: AC | PRN

## 2022-01-21 MED ORDER — NURTEC 75 MG PO TBDP: 75.0000 mg | ORAL_TABLET | ORAL | 6 refills | Status: AC | PRN

## 2022-01-21 NOTE — Progress Notes (Signed)
   CC:  headaches  Follow-up Visit  Last visit: 09/27/21  Brief HPI:  26 year old female who follows in clinic for migraine with aura.  At her last visit she was started on Ubrelvy for migraine rescue.  Interval History: Headaches have worsened in severity since her last visit. She threw up with her last two migraines which is new for her. Ubrelvy did not help. She has been using ice packs for her headaches which helped somewhat. Migraines are worst around her menstrual cycle.  Headache days per month: 8 Headache free days per month: 22  Current Headache Regimen: Preventative: none Abortive: Ubrelvy 100 mg PRN   Prior Therapies                                  Imitrex - vomiting Zomig - nausea Ubrelvy 100 mg PRN - lack of efficacy Zofran Tylenol Excedrin  Physical Exam:   Vital Signs: BP 115/71   Pulse 72   Ht 5\' 4"  (1.626 m)   Wt 127 lb (57.6 kg)   BMI 21.80 kg/m  GENERAL:  well appearing, in no acute distress, alert  SKIN:  Color, texture, turgor normal. No rashes or lesions HEAD:  Normocephalic/atraumatic. RESP: normal respiratory effort MSK:  No gross joint deformities.   NEUROLOGICAL: Mental Status: Alert, oriented to person, place and time, Follows commands, and Speech fluent and appropriate. Cranial Nerves: PERRL, face symmetric, no dysarthria, hearing grossly intact Motor: moves all extremities equally Gait: normal-based.  IMPRESSION: 26 year old female who presents for follow up of migraines. Will order MRI brain as headaches have worsened despite treatment. She would prefer to avoid preventive medications. Provided supplement information (Mg, B2, CoQ10). Will start Nurtec for rescue and compazine PRN for nausea.  PLAN: -MRI brain -Rescue: Start Nurtec 75 mg PRN, start Compazine 10 mg PRN for nausea -Supplement information provided (Mg, B2, CoQ10) -next steps: consider nasal zomig or zavzpret for rescue, consider naratriptan mini-prevention around  menstrual cycle  Follow-up: 4 months  I spent a total of 25 minutes on the date of the service. Headache education was done. Discussed lifestyle modification including increased oral hydration, decreased caffeine, exercise and stress management. Discussed treatment options including acute medications and natural supplements. Discussed medication side effects, adverse reactions and drug interactions. Written educational materials and patient instructions outlining all of the above were given.  25, MD 01/21/22 3:54 PM

## 2022-01-21 NOTE — Patient Instructions (Addendum)
MRI of the brain Start Nurtec (rimegepant) as needed for migraines. Take one pill at onset of migraine. Max dose one pill in 24 hours Start compazine as needed for nausea. Try migrainebuddy app to track your migraines   GENERAL HEADACHE INSTRUCTIONS Headache Preventive Treatment: Please keep in mind that it takes 4-6 weeks for the medication to start working well and 2-3 months at the appropriate dose before deciding if it will be useful or not. If it is not helping at all by this time, then we will discuss other medications to try. Supplements may take 3-6 months until you see full effect.   Natural supplements: Magnesium Glycinate 500 mg at bed (up to 800 mg daily) Coenzyme Q10 300 mg in AM Vitamin B2- 200 mg twice a day  Add 1 supplement at a time since even natural supplements can have undesirable side effects. You can sometimes buy supplements cheaper (especially Coenzyme Q10) at www.WebmailGuide.co.za or at [employer].  Vitamins and herbs that show potential:  Magnesium: Magnesium (250 mg twice a day or 500 mg at bed) has a relaxant effect on smooth muscles such as blood vessels. Individuals suffering from frequent or daily headache usually have low magnesium levels which can be increase with daily supplementation of 400-750 mg. Three trials found 40-90% average headache reduction  when used as a preventative. Magnesium also demonstrated the benefit in menstrually related migraine.  Magnesium is part of the messenger system in the serotonin cascade and it is a good muscle relaxant.  It is also useful for constipation which can be a side effect of other medications used to treat migraine. Good sources include nuts, whole grains, and tomatoes. Side Effects: loose stool/diarrhea Riboflavin (vitamin B 2) 200 mg twice a day. This vitamin assists nerve cells in the production of ATP a principal energy storing molecule.  It is necessary for many chemical reactions in the body.  There have been at least 3  clinical trials of riboflavin using 400 mg per day all of which suggested that migraine frequency can be decreased.  All 3 trials showed significant improvement in over half of migraine sufferers.  The supplement is found in bread, cereal, milk, meat, and poultry.  Most Americans get more riboflavin than the recommended daily allowance, however riboflavin deficiency is not necessary for the supplements to help prevent headache. Side effects: energizing, green urine  Coenzyme Q10: This is present in almost all cells in the body and is critical component for the conversion of energy.  Recent studies have shown that a nutritional supplement of CoQ10 can reduce the frequency of migraine attacks by improving the energy production of cells as with riboflavin.  Doses of 150 mg twice a day have been shown to be effective.  HEADACHE DIET: Foods and beverages which may trigger migraine Note that only 20% of headache patients are food sensitive. You will know if you are food sensitive if you get a headache consistently 20 minutes to 2 hours after eating a certain food. Only cut out a food if it causes headaches, otherwise you might remove foods you enjoy! What matters most for diet is to eat a well balanced healthy diet full of vegetables and low fat protein, and to not miss meals.  Chocolate, other sweets ALL cheeses except cottage and cream cheese Dairy products, yogurt, sour cream, ice cream Liver Meat extracts (Bovril, Marmite, meat tenderizers) Meats or fish which have undergone aging, fermenting, pickling or smoking. These include: Hotdogs,salami,Lox,sausage, mortadellas,smoked salmon, pepperoni, Pickled herring Pods  of broad bean (English beans, Chinese pea pods, [place] (fava) beans, lima and navy beans Ripe avocado, ripe banana Yeast extracts or active yeast preparations such as Brewer's or Fleishman's (commercial bakes goods are permitted) Tomato based foods, pizza (lasagna, etc.)  MSG (monosodium  glutamate) is disguised as many things; look for these common aliases: Monopotassium glutamate Autolysed yeast Hydrolysed protein Sodium caseinate "flavorings" "all natural preservatives" Nutrasweet  Avoid all other foods that convincingly provoke headaches.  Resources: The Dizzy [place]air Laundry Your Headache Diet, migrainestrong.com  https://zamora-andrews.com/  Caffeine and Migraine For patients that have migraine, caffeine intake more than 3 days per week can lead to dependency and increased migraine frequency. I would recommend cutting back on your caffeine intake as best you can. The recommended amount of caffeine is 200-300 mg daily, although migraine patients may experience dependency at even lower doses. While you may notice an increase in headache temporarily, cutting back will be helpful for headaches in the long run. For more information on caffeine and migraine, visit: https://americanmigrainefoundation.org/resource-library/caffeine-and-migraine/  Headache Prevention Strategies:  1. Maintain a headache diary; learn to identify and avoid triggers.  - This can be a simple note where you log when you had a headache, associated symptoms, and medications used - There are several smartphone apps developed to help track migraines: Migraine Buddy, Migraine Monitor, Curelator N1-Headache App  Common triggers include: Emotional triggers: Emotional/Upset family or friends Emotional/Upset occupation Business reversal/success Anticipation anxiety Crisis-serious Post-crisis periodNew job/position   Physical triggers: Vacation Day Weekend Strenuous Exercise High Altitude Location New Move Menstrual Day Physical Illness Oversleep/Not enough sleep Weather changes Light: Photophobia or light sesnitivity treatment involves a balance between desensitization and reduction in overly strong input. Use dark polarized glasses outside, but not  inside. Avoid bright or fluorescent light, but do not dim environment to the point that going into a normally lit room hurts. Consider FL-41 tint lenses, which reduce the most irritating wavelengths without blocking too much light.  These can be obtained at axonoptics.com or theraspecs.com Foods: see list above.  2. Limit use of acute treatments (over-the-counter medications, triptans, etc.) to no more than 2 days per week or 10 days per month to prevent medication overuse headache (rebound headache).    3. Follow a regular schedule (including weekends and holidays): Don't skip meals. Eat a balanced diet. 8 hours of sleep nightly. Minimize stress. Exercise 30 minutes per day. Being overweight is associated with a 5 times increased risk of chronic migraine. Keep well hydrated and drink 6-8 glasses of water per day.  4. Initiate non-pharmacologic measures at the earliest onset of your headache. Rest and quiet environment. Relax and reduce stress. Breathe2Relax is a free app that can instruct you on    some simple relaxtion and breathing techniques. Http://Dawnbuse.com is a    free website that provides teaching videos on relaxation.  Also, there are  many apps that   can be downloaded for "mindful" relaxation.  An app called YOGA NIDRA will help walk you through mindfulness. Another app called Calm can be downloaded to give you a structured mindfulness guide with daily reminders and skill development. Headspace for guided meditation Mindfulness Based Stress Reduction Online Course: www.palousemindfulness.com Cold compresses.  5. Don't wait!! Take the maximum allowable dosage of prescribed medication at the first sign of migraine.  6. Compliance:  Take prescribed medication regularly as directed and at the first sign of a migraine.  7. Communicate:  Call your physician when problems arise, especially if your headaches change,  increase in frequency/severity, or become associated with neurological  symptoms (weakness, numbness, slurred speech, etc.).  8. Headache/pain management therapies: Consider various complementary methods, including medication, behavioral therapy, psychological counselling, biofeedback, massage therapy, acupuncture, dry needling, and other modalities.  Such measures may reduce the need for medications. Counseling for pain management, where patients learn to function and ignore/minimize their pain, seems to work very well.  9. Recommend changing family's attention and focus away from patient's headaches. Instead, emphasize daily activities. If first question of day is 'How are your headaches/Do you have a headache today?', then patient will constantly think about headaches, thus making them worse. Goal is to re-direct attention away from headaches, toward daily activities and other distractions.  10. Helpful Websites: www.AmericanHeadacheSociety.org PatentHood.ch www.headaches.org TightMarket.nl www.achenet.org  11. HEADACHE EXPECTATIONS: There are many types of headaches, and only a rare few in which complete relief can be expected.  In general, there is no cure for headache, especially migraine based headaches.  There is nothing available that completely prevents headaches from occurring, breaking through, or having periodic flare-ups and fluctuations.  Regardless of what you are using on a daily basis for prevention, episodic headaches should still be expected, and periods where frequency may escalate and fluctuate are unavoidable.   There is no quick fix for most headaches.  Furthermore, the longer you have had high frequency headaches (such as chronic daily headache), the longer it will likely take to expect any improvement.  In fact, some people will never improve, regardless of how many medications or other treatments we try.  Our treatment strategy is to evaluate for possible causes of your headache, although testing is usually always normal, even in  cases of daily continuous headaches for years.  Most types of headache such as migraine are electrical brain disorders (similar to how epilepsy is an electrical brain disorders).  Therefore, there is no testing that will reveal this "dysfunctional electrical circuitry" such on MRI, or other testing.  We try to find a medication that may help lessen the frequency and/or severity of your headaches.  The goal is not to completely stop them from happening, although if that happens, great!  Different people respond to different medications, and some people just don't respond to anything, so it's usually a matter of trying different options.  We cannot predict if or when exactly you will respond to a treatment that we provide.   Preventive headache medications take 4-6 weeks to start working, and 2-3 months to see full effect, assuming you reach an effective dose.  Therefore, calling or messaging frequently because you have a headache flare prior to the 3 month mark is unlikely to change anything, and unfortunately there is nothing available that will expedite this, so please try to avoid this.  Our recommendation will generally be to give it adequate time first.  If you are unable to wait it out for medications to work, we can also try IV infusions for some temporary relief.     In general, the best that preventive medications or other treatments (including Botox) are able to offer in migraine management (variable in other headache types) is a 50% improvement in frequency and/or severity of headache.  That is our goal, and any additional benefit is considered a bonus.  Some people do significantly better than this, others do not get close to this.  Therefore, if your headaches are not improving by at least 3 months on your preventive strategy, contact us and we can discuss further adjustments.  Keep in mind that complete headache cure is not a realistic expectation.  Refills: Please pay attention to when your  refills will need to be renewed. Due to the volume of phone calls daily, this could potentially take a few days, although we certainly try to honor your refill requests as soon as we can.  You should call at least 1 week in advance of needing a refill to ensure you do not run out of medication.  Keep in mind that refill requests on Fridays may not be filled until the following week.

## 2022-01-22 ENCOUNTER — Telehealth: Payer: Self-pay | Admitting: *Deleted

## 2022-01-22 NOTE — Telephone Encounter (Signed)
Answered additional clinical questions. Your information has been submitted to [facility]. If [facility] has not responded to your request within 24 hours, contact [employer] at [phone].

## 2022-01-22 NOTE — Telephone Encounter (Signed)
Nurtec PA< Key: [id number]. Your demographic data has been sent to [facility] successfully! [facility] typically takes 5-10 minutes to respond, but it may take a little longer in some cases. You will be notified by email when available. If it has been longer than 24 hours, please reach out to [employer].

## 2022-01-23 NOTE — Telephone Encounter (Signed)
[facility]: We have denied your request because it is for more than the amount your plan covers (quantity limit). We reviewed the information we had. We have partially approved your request for this drug up to the amount your plan covers 16 tablets per month of Nurtec ODT 75mg . Your request for more drug has been denied. OF NOTE: the Rx is for 8 tabs per month, patient should be able to get without issue.

## 2022-02-07 ENCOUNTER — Telehealth: Payer: Self-pay | Admitting: Psychiatry

## 2022-02-07 NOTE — Telephone Encounter (Signed)
The patient and I have been missing each others phone calls for over a week so I am sending a message hoping this will be easier to get her scheduled.

## 2022-02-13 ENCOUNTER — Telehealth: Payer: Self-pay | Admitting: Psychiatry

## 2022-02-13 NOTE — Telephone Encounter (Signed)
Pt has called to report that The medication for her Headaches and nausea was never called into [facility] [phone] .  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

## 2022-02-19 ENCOUNTER — Ambulatory Visit (INDEPENDENT_AMBULATORY_CARE_PROVIDER_SITE_OTHER): Payer: BC Managed Care – PPO

## 2022-02-19 DIAGNOSIS — R519 Headache, unspecified: Secondary | ICD-10-CM | POA: Diagnosis not present

## 2022-02-19 MED ORDER — GADOBENATE DIMEGLUMINE 529 MG/ML IV SOLN
10.0000 mL | Freq: Once | INTRAVENOUS | Status: AC | PRN
  Administered 2022-02-19: 10 mL via INTRAVENOUS

## 2022-03-06 ENCOUNTER — Ambulatory Visit: Payer: BC Managed Care – PPO | Admitting: Psychiatry

## 2022-03-07 ENCOUNTER — Telehealth: Payer: Self-pay | Admitting: Psychiatry

## 2022-03-07 NOTE — Telephone Encounter (Signed)
Pt called wanting to know when her MRI results are going to be received. Please advise.

## 2022-03-07 NOTE — Telephone Encounter (Signed)
Contacted pt, informed her results was sent via mychart on 02/21/22. Advised her results was normal, she was appreciative.

## 2022-03-18 ENCOUNTER — Telehealth: Payer: Self-pay | Admitting: Psychiatry

## 2022-03-18 ENCOUNTER — Encounter: Payer: Self-pay | Admitting: Psychiatry

## 2022-03-18 NOTE — Telephone Encounter (Signed)
That's fine, we can write a letter for her. Does she need a form filled out or just a doctor's note?

## 2022-03-18 NOTE — Telephone Encounter (Signed)
LVM requesting call back for more details.

## 2022-03-18 NOTE — Telephone Encounter (Signed)
Sounds good, I routed a draft over to you to send however she'd like to receive it

## 2022-03-18 NOTE — Telephone Encounter (Signed)
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 Weather and heat, going from hot to cold are triggers for her migraines. 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  She is asking for accommodations that permit her to only be outside a Maximum  20- 25  minutes per class.  Letter to [employer]. Sent to MD for approval.

## 2022-03-18 NOTE — Progress Notes (Addendum)
   Mikhi Athey. MD Department of Neurology [place], [place] [address]    To Whom It May Concern:  Jennifer Smith is a patient in the Department of Neurology at [facility] being followed by me. Her neurologic condition limits her ability to work outside as it is exacerbated by weather and temperature changes. I am requesting she be allowed reasonable work accommodations including limiting her time outside to 20-25 minutes per class.  Please do not hesitate to contact me with any questions.  Sincerely,     Genia Harold, MD 03/18/2022   3:53 PM

## 2022-03-18 NOTE — Telephone Encounter (Signed)
Patient requesting a letter for accommodations for her job regarding migraines and how working outside triggers them. Best call back [phone]

## 2022-03-19 ENCOUNTER — Encounter: Payer: Self-pay | Admitting: *Deleted

## 2022-03-19 ENCOUNTER — Encounter: Payer: Self-pay | Admitting: Psychiatry

## 2022-03-19 NOTE — Telephone Encounter (Signed)
Not sure why it didn't send before, but I re-sent it and it should pop up now

## 2022-03-19 NOTE — Telephone Encounter (Signed)
Letter routed to patient via mychart

## 2022-03-20 ENCOUNTER — Encounter: Payer: Self-pay | Admitting: Psychiatry

## 2022-03-20 NOTE — Telephone Encounter (Signed)
I can edit the letter. How frequently does she feel like she would be able to go outside in a day or week for work?

## 2022-03-20 NOTE — Telephone Encounter (Signed)
I sent her a new letter saying to limit her time outside as tolerated, thanks

## 2022-05-23 NOTE — Progress Notes (Deleted)
Referring:  Harvest Forest, MD [address] [place],  [place] [address]  PCP: Harvest Forest, MD  Neurology was asked to evaluate Jennifer Smith, a 26 year old female for a chief complaint of headaches.  Our recommendations of care will be communicated by shared medical record.    CC:  headaches   Follow-up Visit   Last visit: 01/21/2022 Dr. Delena Bali   Brief HPI:  26 year old female who follows in clinic for migraine with aura.   At her last visit she was started on Nurtec for migraine rescue. Completed MRI brain due to worsening migraines which was unremarkable   Interval History:  Headaches have worsened in severity since her last visit. She threw up with her last two migraines which is new for her. Ubrelvy did not help. She has been using ice packs for her headaches which helped somewhat. Migraines are worst around her menstrual cycle.   Headache days per month: 8 Headache free days per month: 22   Current Headache Regimen: Preventative: none Abortive: Nurtec     Prior Therapies                                  Imitrex - vomiting Zomig - nausea Ubrelvy 100 mg PRN - lack of efficacy Zofran Tylenol Excedrin      LABS: CBC    Component Value Date/Time   WBC 11.8 (H) 04/08/2019 1655   RBC 5.44 (H) 04/08/2019 1655   HGB 12.6 04/08/2019 1655   HCT 41.6 04/08/2019 1655   PLT 390 04/08/2019 1655   MCV 76.5 (L) 04/08/2019 1655   MCH 23.2 (L) 04/08/2019 1655   MCHC 30.3 04/08/2019 1655   RDW 13.9 04/08/2019 1655   LYMPHSABS 3.3 09/03/2017 1156   MONOABS 0.7 09/03/2017 1156   EOSABS 0.1 09/03/2017 1156   BASOSABS 0.1 09/03/2017 1156      Latest Ref Rng & Units 04/08/2019    4:55 PM 09/03/2017   11:56 AM  CMP  Glucose 70 - 99 mg/dL 75  397   BUN 6 - 20 mg/dL 10  13   Creatinine 6.73 - 1.00 mg/dL 4.19  3.79   Sodium 024 - 145 mmol/L 138  144   Potassium 3.5 - 5.1 mmol/L 3.6  3.9   Chloride 98 - 111 mmol/L 107  106   CO2 22 - 32 mmol/L  22  24   Calcium 8.9 - 10.3 mg/dL 9.5  09.7   Total Protein 6.0 - 8.3 g/dL  7.3   Total Bilirubin 0.2 - 1.2 mg/dL  0.1   Alkaline Phos 47 - 119 U/L  77   AST 0 - 37 U/L  15   ALT 0 - 35 U/L  10      IMAGING:  MRI brain when she was 14-15 was reportedly normal  Current Outpatient Medications on File Prior to Visit  Medication Sig Dispense Refill   Ferrous Sulfate (IRON PO) Take by mouth. OTC gummy     fluconazole (DIFLUCAN) 150 MG tablet Take 150 mg by mouth once a week.     Levonorgest-Eth Estrad 91-Day (SEASONALE PO) Take 1 tablet by mouth daily.     pediatric multivitamin-iron (POLY-VI-SOL WITH IRON) 15 MG chewable tablet Chew 2 tablets by mouth daily.      prochlorperazine (COMPAZINE) 10 MG tablet Take 1 tablet (10 mg total) by mouth every 6 (six) hours  as needed for nausea or vomiting. 30 tablet 6   Rimegepant Sulfate (NURTEC) 75 MG TBDP Take 75 mg by mouth as needed. 8 tablet 6   No current facility-administered medications on file prior to visit.     Allergies: Allergies  Allergen Reactions   Imitrex [Sumatriptan] Nausea And Vomiting   Zomig [Zolmitriptan] Nausea Only    Family History: Migraine or other headaches in the family:  grandmother Aneurysms in a first degree relative:  no Brain tumors in the family:  no Other neurological illness in the family:   no  Past Medical History: Past Medical History:  Diagnosis Date   Acid reflux    Migraine     Past Surgical History Past Surgical History:  Procedure Laterality Date   WISDOM TOOTH EXTRACTION      Social History: Social History   Tobacco Use   Smoking status: Never   Smokeless tobacco: Never  Vaping Use   Vaping Use: Never used  Substance Use Topics   Alcohol use: No   Drug use: Yes    Types: Marijuana     ROS: Negative for fevers, chills. Positive for headaches. All other systems reviewed and negative unless stated otherwise in HPI.   Physical Exam:   Vital Signs: There were no  vitals taken for this visit. GENERAL: well appearing,in no acute distress,alert SKIN:  Color, texture, turgor normal. No rashes or lesions HEAD:  Normocephalic/atraumatic. CV:  RRR RESP: Normal respiratory effort MSK: no tenderness to palpation over occiput, neck, or shoulders  NEUROLOGICAL: Mental Status: Alert, oriented to person, place and time,Follows commands Cranial Nerves: PERRL,visual fields intact to confrontation,extraocular movements intact,facial sensation intact,no facial droop or ptosis,hearing intact to finger rub bilaterally,no dysarthria Motor: muscle strength 5/5 both upper and lower extremities,no drift, normal tone Reflexes: 2+ throughout Sensation: intact to light touch all 4 extremities Coordination: Finger-to- nose-finger intact bilaterally Gait: normal-based   IMPRESSION: 26 year old female who presents for follow up of migraines followed by Dr. Delena Bali.  MRI brain unremarkable.    Will order MRI brain as headaches have worsened despite treatment. She would prefer to avoid preventive medications. Provided supplement information (Mg, B2, CoQ10). Will start Nurtec for rescue and compazine PRN for nausea.     PLAN:  -Rescue: Start Nurtec 75 mg PRN, start Compazine 10 mg PRN for nausea -Supplement information provided (Mg, B2, CoQ10) -next steps: consider nasal zomig or zavzpret for rescue, consider naratriptan mini-prevention around menstrual cycle       I spent *** minutes of face-to-face and non-face-to-face time with patient.  This included previsit chart review, lab review, study review, order entry, electronic health record documentation, patient education  Ihor Austin, [facility]  [facility] [address] [place], [place] [address]  Phone [phone] Fax [phone] Note: This document was prepared with digital dictation and possible smart phrase technology. Any transcriptional errors that result from this  process are unintentional.

## 2022-05-27 ENCOUNTER — Ambulatory Visit: Payer: BC Managed Care – PPO | Admitting: Adult Health

## 2022-11-16 ENCOUNTER — Telehealth: Payer: Self-pay | Admitting: Pharmacy Technician

## 2022-11-16 NOTE — Telephone Encounter (Signed)
Patient Advocate Encounter  Received notification from [facility] that prior authorization for UBRELVY 100MG  is required.   PA submitted on 6.1.24 Key [id number] Status is pending

## 2022-11-18 ENCOUNTER — Other Ambulatory Visit (HOSPITAL_COMMUNITY): Payer: Self-pay

## 2022-11-18 NOTE — Telephone Encounter (Signed)
Pharmacy Patient Advocate Encounter  Prior Authorization for Ubrelvy 100MG  tablets has been APPROVED by [facility] from 11/16/2022 to 11/16/2023.   PA # PA Case ID #: [id number]  Copay is $0 per [facility] test claim.

## 2023-05-01 ENCOUNTER — Telehealth: Payer: Self-pay | Admitting: Psychiatry

## 2023-05-01 NOTE — Telephone Encounter (Signed)
Called the pt back & LVM asking for call back.   Looks like her diagnosis was Migraine w/ aura. Her chart says she is on an estrogen-containing birth control.

## 2023-05-01 NOTE — Telephone Encounter (Signed)
The patient returned my call.  She states she saw OB/GYN and they noted a previous diagnosis on her chart from our office indicating Migraine w/ aura.  The patient states she has been on birth control since she was 27 years old and has had migraines since childhood.  Her OB/GYN brought up concerns of estrogen containing birth control that she is on.  The patient states that she has never had an aura that she knows of. She states maybe in the past she has "seen stars" for a few seconds but she does not feel this had anything to do with her migraines and has not happened often.  She also stated that she has moved to [place] and will not be coming back to our office due to the distance. I told her I would run the situation by a work-in provider to confirm that she would be ok to continue her estrogen-containing birth control since she denies aura. I told her that Dr. Delena Bali but is no longer in our office.

## 2023-05-01 NOTE — Telephone Encounter (Signed)
Pt called requesting to speak to an RN regarding a medication contradiction with her dx here and the birth control she is on. Please advise.

## 2023-05-05 NOTE — Telephone Encounter (Signed)
I called pt. Relayed Dr. Teofilo Pod message. She states she has never had any auras with her migraines. Unable to explain why initial dx w/o aura and then 01/2022 dx w/ aura in Dr. Delena Bali notes. She will ask PCP about referral to neurologist in [place] area if she wants to get established over there. I recommended she do this in case migraines worsen or she develops any new sx. She verbalized understanding.

## 2023-05-05 NOTE — Telephone Encounter (Signed)
Dr. Frances Furbish- you are WID this am. Thank you

## 2023-05-05 NOTE — Telephone Encounter (Signed)
I recommend that she discuss with her GYN options for her birth control and with her new neurologist any medication options going forward for her migraines.

## 2023-07-31 IMAGING — US US ABDOMEN LIMITED
1 series · 14 of 25 positions shown · non-contrast
Comparison: None.

CLINICAL DATA: Elevated LFTs

EXAM:
ULTRASOUND ABDOMEN LIMITED RIGHT UPPER QUADRANT

[Series 1: us abdomen limited · 0.17mm/px · 14 of 32 slices shown]
[im 1/32]
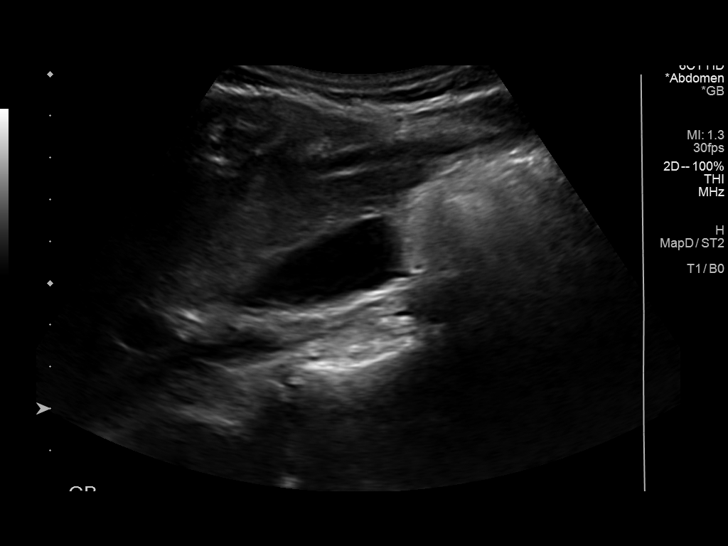
[im 3/32]
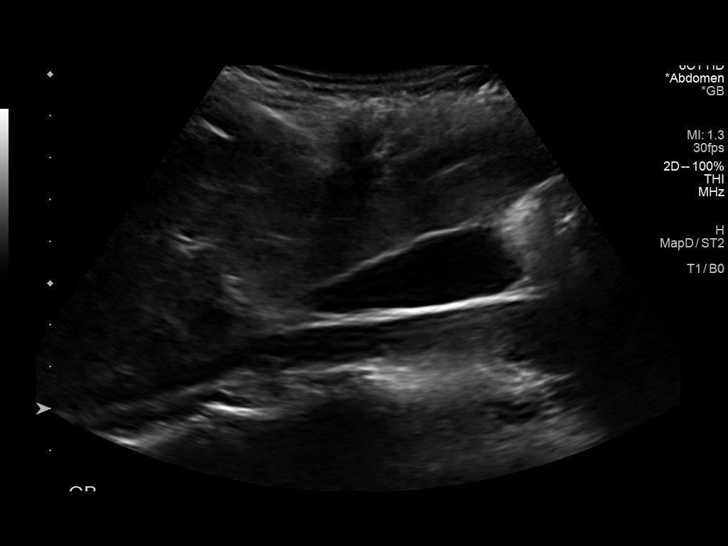
[im 6/32]
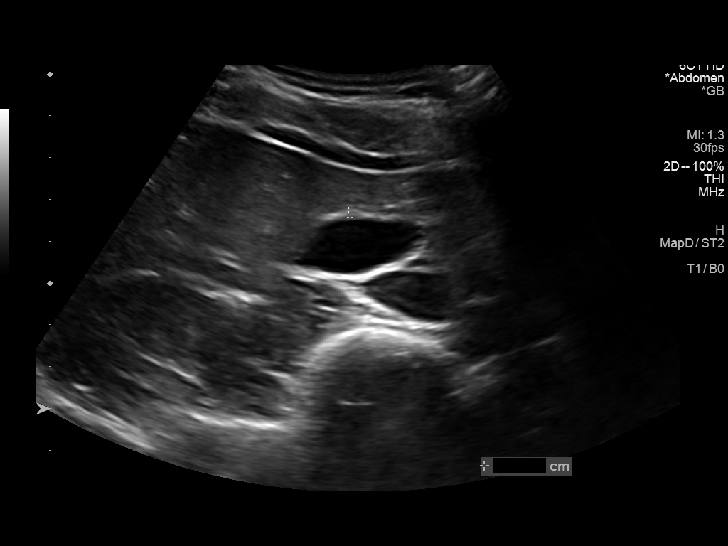
[im 8/32]
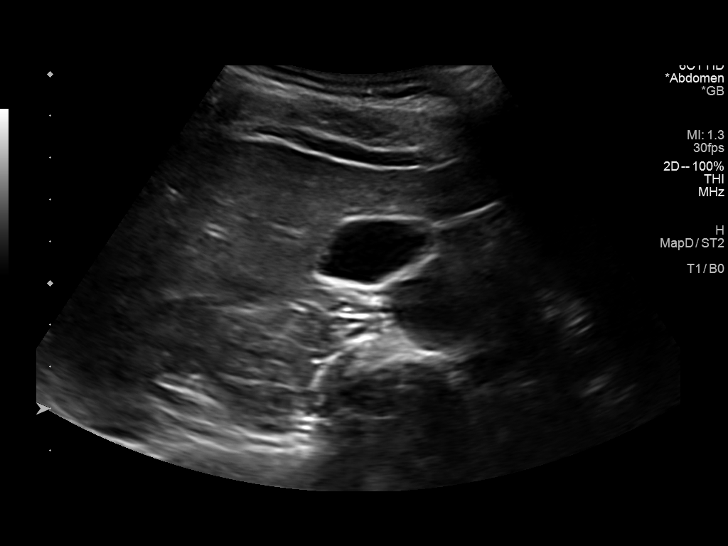
[im 11/32]
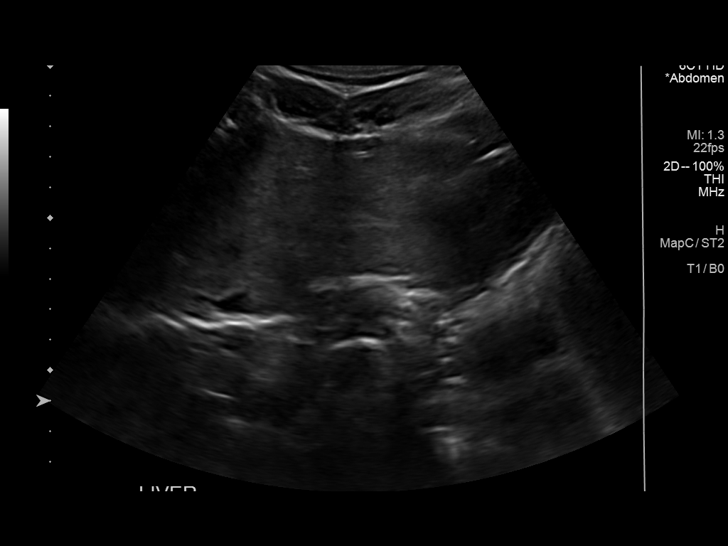
[im 12/32]
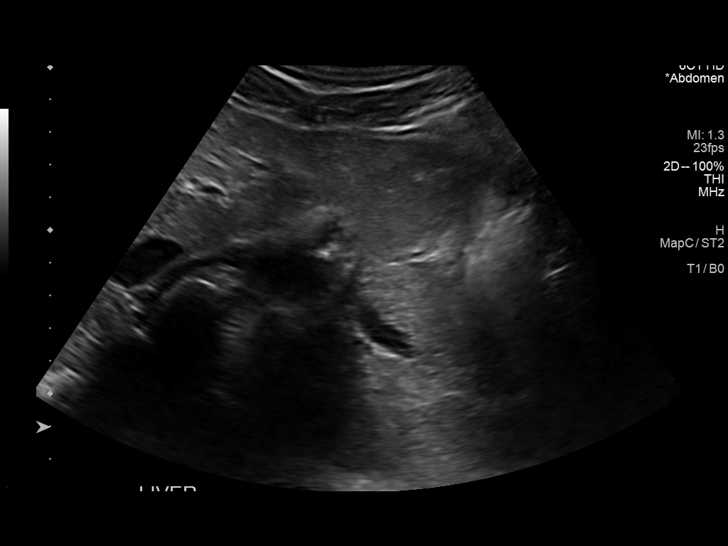
[im 15/32]
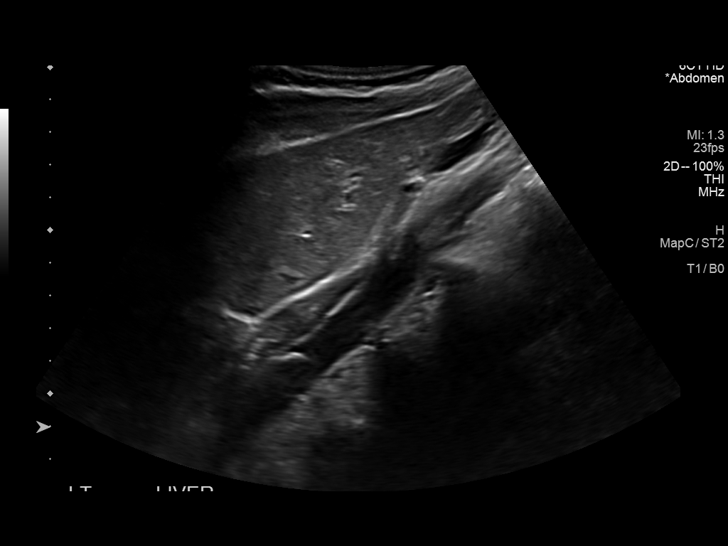
[im 17/32]
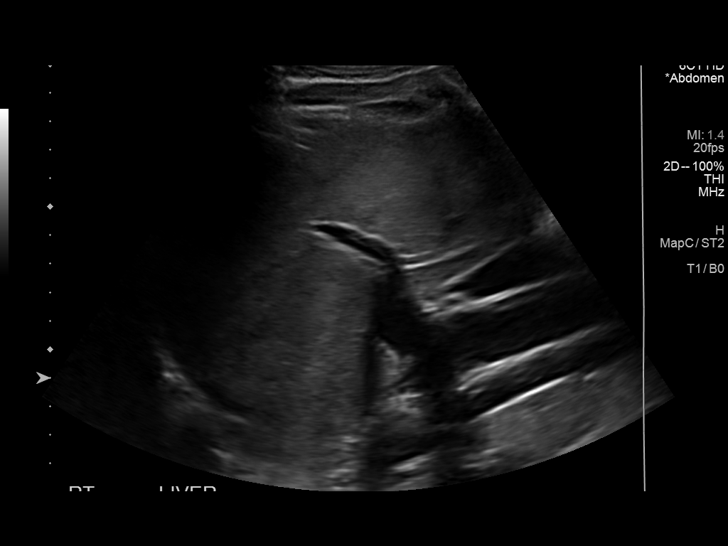
[im 20/32]
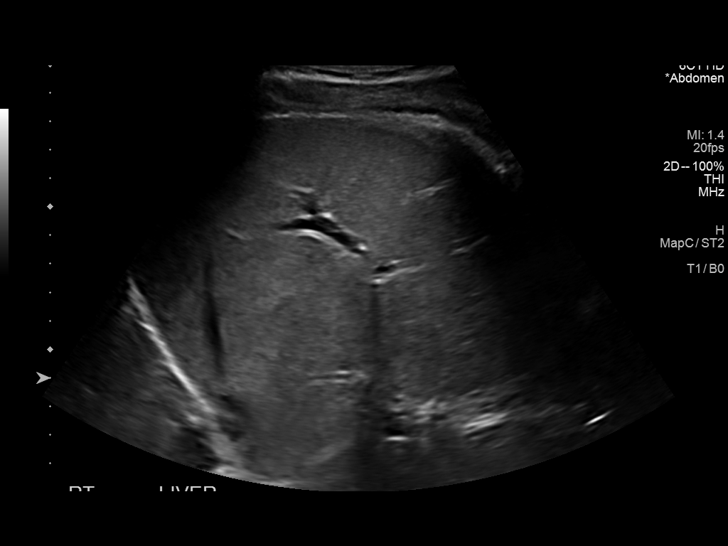
[im 21/32]
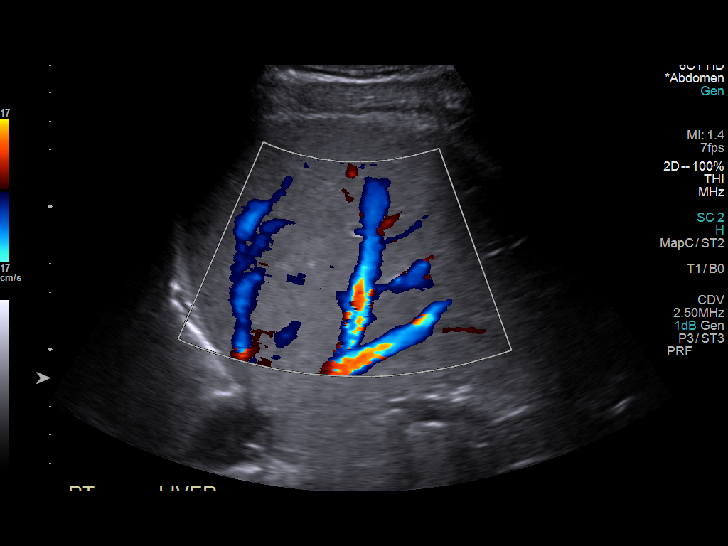
[im 24/32]
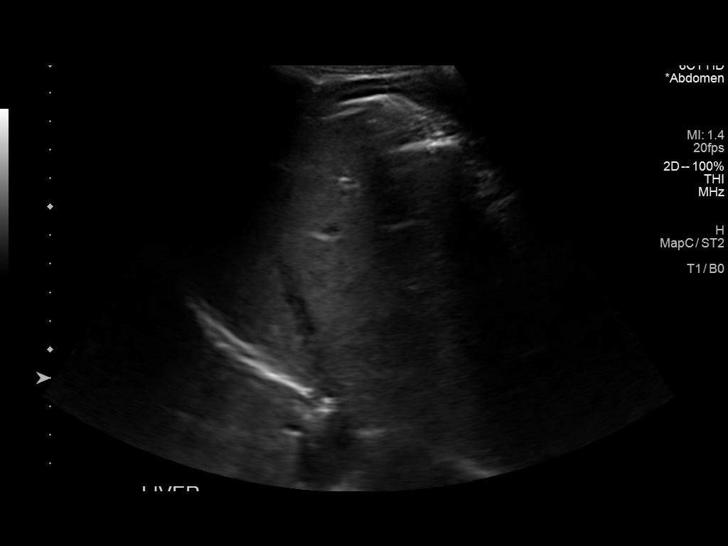
[im 26/32]
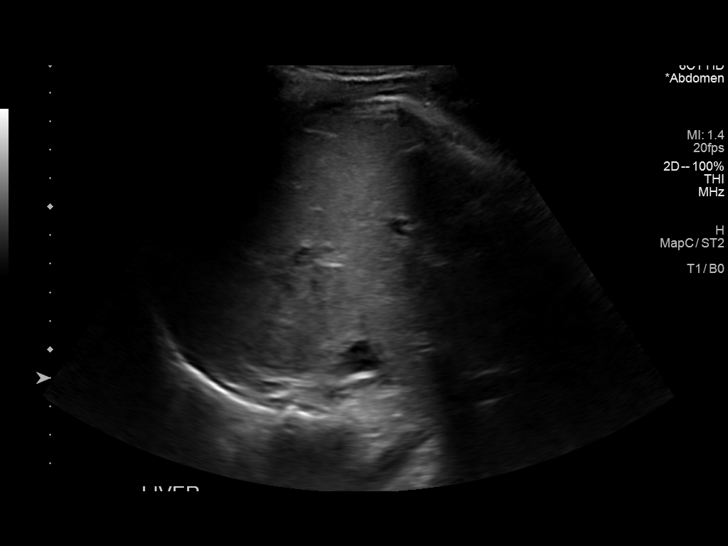
[im 29/32]
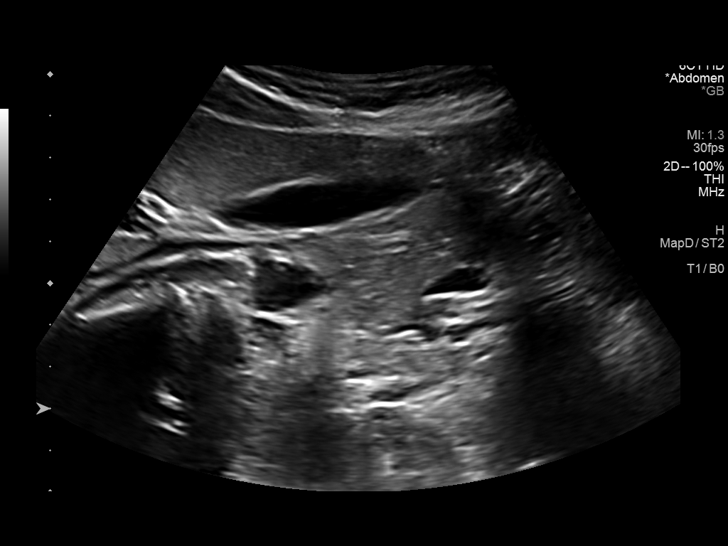
[im 32/32]
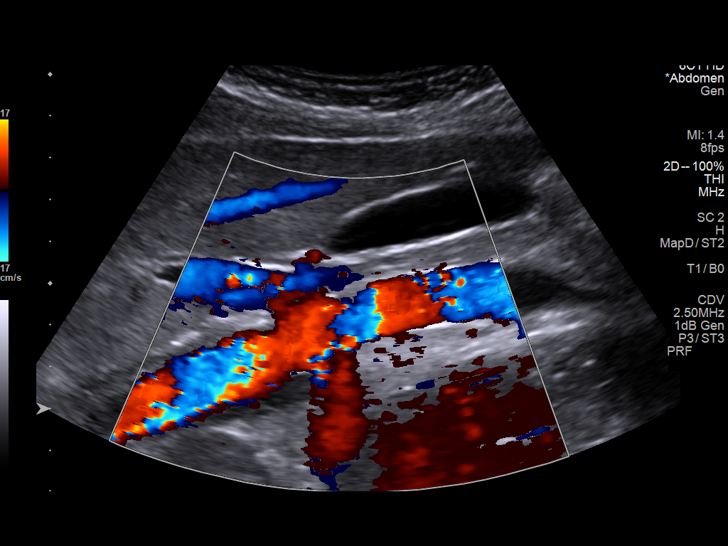

[14 of 25 positions shown; findings below may reference images not displayed]

FINDINGS: Gallbladder:

No gallstones or wall thickening visualized. No sonographic Murphy
sign noted by sonographer.

Common bile duct:

Diameter: 1.2 mm

Liver:

No focal lesion identified. Within normal limits in parenchymal
echogenicity. Portal vein is patent on color Doppler imaging with
normal direction of blood flow towards the liver.

Other: None.
IMPRESSION: Normal right upper quadrant abdominal ultrasound.

## 2023-11-17 ENCOUNTER — Telehealth: Payer: Self-pay

## 2023-11-17 NOTE — Telephone Encounter (Signed)
 Pharmacy Patient Advocate Encounter   Received notification from CoverMyMeds that prior authorization for Ubrelvy  100MG  tablets is due for renewal.   Insurance verification completed.   The patient is insured through [facility].  Action: Patient hasn't been seen in your office in over a year. Plan requires updated chart notes for PA renewal.

## 2024-01-23 ENCOUNTER — Telehealth: Payer: Self-pay | Admitting: Gastroenterology

## 2024-01-23 NOTE — Telephone Encounter (Signed)
 Inbound call from patient's mother stating patient was diagnosed with chronic gastritis in 2019 after endoscopy with Dr. Aneita. States patient still misses work sometimes due to stomach pains. States patient now lives in [place] and is looking for a GI provider there. In the meantime patient's mother is requesting to speak regarding FMLA paperwork. Advised since patient has not been seen with us  in quite some time and her provider is now retired a appointment may be required. Patient's mom is requesting a call to discuss further. Please advise, thank you.

## 2024-01-23 NOTE — Telephone Encounter (Signed)
 Advised the pt and her mother that she would need an office visit for eval since she has not been seen since 2019 and Dr Aneita has retired.  She will discuss and call back if she decides to make appt since she has moved to [place].
